# Patient Record
Sex: Female | Born: 1965
Health system: Southern US, Community
[De-identification: ages and names within clinical notes are randomized; demographics above are authoritative.]

---
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
## 1998-11-03 ENCOUNTER — Encounter: Admission: RE | Admit: 1998-11-03 | Discharge: 1999-02-01 | Payer: Self-pay | Admitting: Gynecology

## 1998-11-30 ENCOUNTER — Inpatient Hospital Stay (HOSPITAL_COMMUNITY): Admission: AD | Admit: 1998-11-30 | Discharge: 1998-12-02 | Payer: Self-pay | Admitting: Gynecology

## 1998-12-06 ENCOUNTER — Encounter (HOSPITAL_COMMUNITY): Admission: RE | Admit: 1998-12-06 | Discharge: 1999-03-06 | Payer: Self-pay | Admitting: Obstetrics and Gynecology

## 1999-01-05 ENCOUNTER — Other Ambulatory Visit: Admission: RE | Admit: 1999-01-05 | Discharge: 1999-01-05 | Payer: Self-pay | Admitting: Gynecology

## 2000-01-27 ENCOUNTER — Other Ambulatory Visit: Admission: RE | Admit: 2000-01-27 | Discharge: 2000-01-27 | Payer: Self-pay | Admitting: Gynecology

## 2000-09-10 ENCOUNTER — Ambulatory Visit (HOSPITAL_COMMUNITY): Admission: RE | Admit: 2000-09-10 | Discharge: 2000-09-10 | Payer: Self-pay | Admitting: Internal Medicine

## 2000-09-10 ENCOUNTER — Encounter: Payer: Self-pay | Admitting: Internal Medicine

## 2001-01-22 ENCOUNTER — Encounter: Admission: RE | Admit: 2001-01-22 | Discharge: 2001-01-29 | Payer: Self-pay | Admitting: Occupational Medicine

## 2001-02-04 ENCOUNTER — Other Ambulatory Visit: Admission: RE | Admit: 2001-02-04 | Discharge: 2001-02-04 | Payer: Self-pay | Admitting: Gynecology

## 2001-02-06 ENCOUNTER — Encounter (INDEPENDENT_AMBULATORY_CARE_PROVIDER_SITE_OTHER): Payer: Self-pay

## 2001-02-06 ENCOUNTER — Other Ambulatory Visit: Admission: RE | Admit: 2001-02-06 | Discharge: 2001-02-06 | Payer: Self-pay | Admitting: Gynecology

## 2001-08-06 ENCOUNTER — Other Ambulatory Visit: Admission: RE | Admit: 2001-08-06 | Discharge: 2001-08-06 | Payer: Self-pay | Admitting: Gynecology

## 2001-08-28 ENCOUNTER — Encounter: Admission: RE | Admit: 2001-08-28 | Discharge: 2001-11-26 | Payer: Self-pay | Admitting: Gynecology

## 2002-02-26 ENCOUNTER — Inpatient Hospital Stay (HOSPITAL_COMMUNITY): Admission: AD | Admit: 2002-02-26 | Discharge: 2002-02-26 | Payer: Self-pay | Admitting: Gynecology

## 2002-03-05 ENCOUNTER — Inpatient Hospital Stay (HOSPITAL_COMMUNITY): Admission: AD | Admit: 2002-03-05 | Discharge: 2002-03-07 | Payer: Self-pay | Admitting: Gynecology

## 2002-04-15 ENCOUNTER — Other Ambulatory Visit: Admission: RE | Admit: 2002-04-15 | Discharge: 2002-04-15 | Payer: Self-pay | Admitting: Gynecology

## 2003-06-08 ENCOUNTER — Other Ambulatory Visit: Admission: RE | Admit: 2003-06-08 | Discharge: 2003-06-08 | Payer: Self-pay | Admitting: Gynecology

## 2003-11-13 ENCOUNTER — Ambulatory Visit (HOSPITAL_COMMUNITY): Admission: RE | Admit: 2003-11-13 | Discharge: 2003-11-13 | Payer: Self-pay | Admitting: Gynecology

## 2003-11-27 ENCOUNTER — Other Ambulatory Visit: Admission: RE | Admit: 2003-11-27 | Discharge: 2003-11-27 | Payer: Self-pay | Admitting: Gynecology

## 2004-07-05 ENCOUNTER — Other Ambulatory Visit: Admission: RE | Admit: 2004-07-05 | Discharge: 2004-07-05 | Payer: Self-pay | Admitting: Gynecology

## 2004-11-14 ENCOUNTER — Ambulatory Visit (HOSPITAL_COMMUNITY): Admission: RE | Admit: 2004-11-14 | Discharge: 2004-11-14 | Payer: Self-pay | Admitting: Gynecology

## 2005-05-27 ENCOUNTER — Emergency Department (HOSPITAL_COMMUNITY): Admission: EM | Admit: 2005-05-27 | Discharge: 2005-05-27 | Payer: Self-pay | Admitting: Emergency Medicine

## 2005-07-10 ENCOUNTER — Other Ambulatory Visit: Admission: RE | Admit: 2005-07-10 | Discharge: 2005-07-10 | Payer: Self-pay | Admitting: Gynecology

## 2005-12-21 ENCOUNTER — Ambulatory Visit (HOSPITAL_COMMUNITY): Admission: RE | Admit: 2005-12-21 | Discharge: 2005-12-21 | Payer: Self-pay | Admitting: Gynecology

## 2006-07-02 ENCOUNTER — Emergency Department (HOSPITAL_COMMUNITY): Admission: EM | Admit: 2006-07-02 | Discharge: 2006-07-02 | Payer: Self-pay | Admitting: Family Medicine

## 2006-07-17 ENCOUNTER — Other Ambulatory Visit: Admission: RE | Admit: 2006-07-17 | Discharge: 2006-07-17 | Payer: Self-pay | Admitting: Gynecology

## 2006-08-23 ENCOUNTER — Encounter: Admission: RE | Admit: 2006-08-23 | Discharge: 2006-11-21 | Payer: Self-pay | Admitting: Family Medicine

## 2007-01-10 ENCOUNTER — Ambulatory Visit (HOSPITAL_COMMUNITY): Admission: RE | Admit: 2007-01-10 | Discharge: 2007-01-10 | Payer: Self-pay | Admitting: Gynecology

## 2008-02-03 ENCOUNTER — Ambulatory Visit (HOSPITAL_COMMUNITY): Admission: RE | Admit: 2008-02-03 | Discharge: 2008-02-03 | Payer: Self-pay | Admitting: Gynecology

## 2008-09-16 ENCOUNTER — Emergency Department (HOSPITAL_COMMUNITY): Admission: EM | Admit: 2008-09-16 | Discharge: 2008-09-16 | Payer: Self-pay | Admitting: Emergency Medicine

## 2009-03-03 ENCOUNTER — Ambulatory Visit (HOSPITAL_COMMUNITY): Admission: RE | Admit: 2009-03-03 | Discharge: 2009-03-03 | Payer: Self-pay | Admitting: Obstetrics and Gynecology

## 2009-12-17 ENCOUNTER — Emergency Department (HOSPITAL_COMMUNITY)
Admission: EM | Admit: 2009-12-17 | Discharge: 2009-12-17 | Payer: Self-pay | Source: Home / Self Care | Admitting: Emergency Medicine

## 2010-07-07 ENCOUNTER — Ambulatory Visit (HOSPITAL_COMMUNITY): Admission: RE | Admit: 2010-07-07 | Discharge: 2010-07-07 | Payer: Self-pay | Admitting: Obstetrics and Gynecology

## 2010-12-26 ENCOUNTER — Ambulatory Visit: Payer: 59 | Attending: Sports Medicine | Admitting: Physical Therapy

## 2010-12-26 DIAGNOSIS — M25619 Stiffness of unspecified shoulder, not elsewhere classified: Secondary | ICD-10-CM | POA: Insufficient documentation

## 2010-12-26 DIAGNOSIS — M25519 Pain in unspecified shoulder: Secondary | ICD-10-CM | POA: Insufficient documentation

## 2010-12-26 DIAGNOSIS — IMO0001 Reserved for inherently not codable concepts without codable children: Secondary | ICD-10-CM | POA: Insufficient documentation

## 2011-01-02 ENCOUNTER — Ambulatory Visit: Payer: 59 | Admitting: Physical Therapy

## 2011-01-04 ENCOUNTER — Encounter: Payer: 59 | Admitting: Physical Therapy

## 2011-01-09 ENCOUNTER — Ambulatory Visit: Payer: 59 | Admitting: Physical Therapy

## 2011-01-23 ENCOUNTER — Ambulatory Visit: Payer: 59 | Attending: Sports Medicine | Admitting: Physical Therapy

## 2011-01-23 DIAGNOSIS — IMO0001 Reserved for inherently not codable concepts without codable children: Secondary | ICD-10-CM | POA: Insufficient documentation

## 2011-01-23 DIAGNOSIS — M25619 Stiffness of unspecified shoulder, not elsewhere classified: Secondary | ICD-10-CM | POA: Insufficient documentation

## 2011-01-23 DIAGNOSIS — M25519 Pain in unspecified shoulder: Secondary | ICD-10-CM | POA: Insufficient documentation

## 2011-02-01 ENCOUNTER — Ambulatory Visit: Payer: 59 | Admitting: Physical Therapy

## 2011-02-17 ENCOUNTER — Other Ambulatory Visit (HOSPITAL_COMMUNITY): Payer: Self-pay | Admitting: Otolaryngology

## 2011-02-17 DIAGNOSIS — D333 Benign neoplasm of cranial nerves: Secondary | ICD-10-CM

## 2011-02-21 ENCOUNTER — Ambulatory Visit (HOSPITAL_COMMUNITY)
Admission: RE | Admit: 2011-02-21 | Discharge: 2011-02-21 | Disposition: A | Discharge status: Home / Self Care | Payer: 59 | Source: Ambulatory Visit | Attending: Otolaryngology | Admitting: Otolaryngology

## 2011-02-21 DIAGNOSIS — D333 Benign neoplasm of cranial nerves: Secondary | ICD-10-CM

## 2011-02-21 DIAGNOSIS — R42 Dizziness and giddiness: Secondary | ICD-10-CM | POA: Insufficient documentation

## 2011-02-21 MED ORDER — GADOBENATE DIMEGLUMINE 529 MG/ML IV SOLN
13.0000 mL | Freq: Once | INTRAVENOUS | Status: AC | PRN
  Administered 2011-02-21: 13 mL via INTRAVENOUS

## 2011-03-10 NOTE — Discharge Summary (Signed)
[facility]  Patient:    ALYVIAH, CRANDLE Visit Number: [id number] MRN: [id number]          Service Type: OBS Location: [id number] Attending Physician:  Douglass Rivers Dictated by:   Antony Contras, [facility] Admit Date:  03/05/2002 Discharge Date: 03/07/2002                             Discharge Summary  DISCHARGE DIAGNOSES:          Intrauterine pregnancy at term, spontaneous onset of labor, precipitous delivery, viable infant.  HISTORY OF PRESENT ILLNESS:   Patient is a 45 year old gravida 3, para 1-0-1-1, LMP May 28, 2001, [facility] Mar 04, 2002.  Prenatal course complicated by advanced maternal age, glucose intolerance.  Husband has HSV.  PRENATAL LABORATORIES:        Blood type B+.  Antibody screen negative.  RPR, HBSAG, HIV nonreactive.  GBS is positive.  HOSPITAL COURSE:              Patient was admitted on Mar 05, 2002 with spontaneous onset of labor.  She progressed quickly to complete dilatation. Precipitously delivered an Apgar 9/9 female infant over a midline episiotomy. Birth weight 7 pounds 9 ounces.  Placenta was manually extracted.  The patient was not given antibiotics secondary to precipitous delivery.  Postpartum course she remained afebrile.  Had no difficulty voiding.  Was able to be discharged in satisfactory condition on her second postpartum day.  DISCHARGE LABORATORIES:       CBC:  Hematocrit 30.8, hemoglobin 10.5, WBC 14.9, platelets 336,000.  DISPOSITION:                  Follow up in six weeks.  Continue prenatal vitamins and iron.  Motrin and Tylox for pain. Dictated by:   Antony Contras, [facility] Attending Physician:  Douglass Rivers DD:  03/25/02 TD:  03/27/02 Job: [id number]

## 2011-08-08 ENCOUNTER — Other Ambulatory Visit (HOSPITAL_COMMUNITY): Payer: Self-pay | Admitting: Obstetrics and Gynecology

## 2011-08-08 DIAGNOSIS — Z1231 Encounter for screening mammogram for malignant neoplasm of breast: Secondary | ICD-10-CM

## 2011-08-24 ENCOUNTER — Ambulatory Visit (HOSPITAL_COMMUNITY): Payer: 59

## 2011-09-20 ENCOUNTER — Ambulatory Visit (HOSPITAL_COMMUNITY)
Admission: RE | Admit: 2011-09-20 | Discharge: 2011-09-20 | Disposition: A | Discharge status: Home / Self Care | Payer: 59 | Source: Ambulatory Visit | Attending: Obstetrics and Gynecology | Admitting: Obstetrics and Gynecology

## 2011-09-20 DIAGNOSIS — Z1231 Encounter for screening mammogram for malignant neoplasm of breast: Secondary | ICD-10-CM | POA: Insufficient documentation

## 2012-09-04 ENCOUNTER — Encounter (INDEPENDENT_AMBULATORY_CARE_PROVIDER_SITE_OTHER): Payer: Self-pay

## 2012-09-05 ENCOUNTER — Ambulatory Visit (INDEPENDENT_AMBULATORY_CARE_PROVIDER_SITE_OTHER): Payer: Commercial Managed Care - PPO | Admitting: General Surgery

## 2012-09-05 ENCOUNTER — Encounter (INDEPENDENT_AMBULATORY_CARE_PROVIDER_SITE_OTHER): Payer: Self-pay | Admitting: General Surgery

## 2012-09-05 DIAGNOSIS — L7211 Pilar cyst: Secondary | ICD-10-CM

## 2012-09-05 NOTE — Patient Instructions (Signed)
We will schedule your surgery today. 
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
## 2012-09-06 NOTE — Progress Notes (Signed)
Talked with pt. No questions

## 2012-09-10 ENCOUNTER — Ambulatory Visit (HOSPITAL_BASED_OUTPATIENT_CLINIC_OR_DEPARTMENT_OTHER)
Admission: RE | Admit: 2012-09-10 | Discharge: 2012-09-10 | Disposition: A | Discharge status: Home / Self Care | Payer: 59 | Source: Ambulatory Visit | Attending: General Surgery | Admitting: General Surgery

## 2012-09-10 ENCOUNTER — Encounter (HOSPITAL_BASED_OUTPATIENT_CLINIC_OR_DEPARTMENT_OTHER): Payer: Self-pay | Admitting: *Deleted

## 2012-09-10 ENCOUNTER — Encounter (HOSPITAL_BASED_OUTPATIENT_CLINIC_OR_DEPARTMENT_OTHER)
Admission: RE | Disposition: A | Discharge status: Home / Self Care | Payer: Self-pay | Source: Ambulatory Visit | Attending: General Surgery

## 2012-09-10 DIAGNOSIS — L7211 Pilar cyst: Secondary | ICD-10-CM | POA: Insufficient documentation

## 2012-09-10 HISTORY — PX: LIPOMA EXCISION: SHX5283

## 2012-09-10 SURGERY — MINOR EXCISION LIPOMA
Anesthesia: LOCAL | Site: Scalp | Wound class: Clean

## 2012-09-10 MED ORDER — LIDOCAINE-EPINEPHRINE (PF) 1 %-1:200000 IJ SOLN
INTRAMUSCULAR | Status: DC | PRN
  Administered 2012-09-10: 13:00:00 via INTRAMUSCULAR

## 2012-09-10 SURGICAL SUPPLY — 24 items
BENZOIN TINCTURE PRP APPL 2/3 (GAUZE/BANDAGES/DRESSINGS) IMPLANT
BLADE SURG 10 STRL SS (BLADE) IMPLANT
BLADE SURG 15 STRL LF DISP TIS (BLADE) ×1 IMPLANT
BLADE SURG 15 STRL SS (BLADE) ×1
CHLORAPREP W/TINT 26ML (MISCELLANEOUS) IMPLANT
CLOTH BEACON ORANGE TIMEOUT ST (SAFETY) ×2 IMPLANT
COVER SURGICAL LIGHT HANDLE (MISCELLANEOUS) ×2 IMPLANT
DERMABOND ADVANCED (GAUZE/BANDAGES/DRESSINGS) ×1
DERMABOND ADVANCED .7 DNX12 (GAUZE/BANDAGES/DRESSINGS) ×1 IMPLANT
ELECT COATED BLADE 2.86 ST (ELECTRODE) IMPLANT
ELECT REM PT RETURN 9FT ADLT (ELECTROSURGICAL) ×2
ELECTRODE REM PT RTRN 9FT ADLT (ELECTROSURGICAL) ×1 IMPLANT
GAUZE SPONGE 4X4 12PLY STRL LF (GAUZE/BANDAGES/DRESSINGS) IMPLANT
GLOVE BIOGEL PI IND STRL 8.5 (GLOVE) ×1 IMPLANT
GLOVE BIOGEL PI INDICATOR 8.5 (GLOVE) ×1
GLOVE ECLIPSE 8.0 STRL XLNG CF (GLOVE) ×2 IMPLANT
NEEDLE HYPO 25X1 1.5 SAFETY (NEEDLE) ×2 IMPLANT
PENCIL BUTTON HOLSTER BLD 10FT (ELECTRODE) IMPLANT
SPONGE GAUZE 4X4 12PLY (GAUZE/BANDAGES/DRESSINGS) IMPLANT
STRIP CLOSURE SKIN 1/2X4 (GAUZE/BANDAGES/DRESSINGS) IMPLANT
SUT MNCRL AB 3-0 PS2 18 (SUTURE) ×2 IMPLANT
SUT MON AB 4-0 PC3 18 (SUTURE) IMPLANT
SUT VICRYL 3-0 CR8 SH (SUTURE) IMPLANT
SYR CONTROL 10ML LL (SYRINGE) ×2 IMPLANT

## 2012-09-10 NOTE — Interval H&P Note (Signed)
History and Physical Interval Note:  09/10/2012 12:38 PM  Rachel Norman  has presented today for surgery, with the diagnosis of pilar cyst  The various methods of treatment have been discussed with the patient and family. After consideration of risks, benefits and other options for treatment, the patient has consented to  Procedure(s) (LRB) with comments: MINOR EXCISION LIPOMA (N/A) - excision of scalp mass subq as a surgical intervention .  The patient's history has been reviewed, patient examined, no change in status, stable for surgery.  I have reviewed the patient's chart and labs.  Questions were answered to the patient's satisfaction.     Ousman Dise Shela Commons

## 2012-09-10 NOTE — Op Note (Signed)
Operative Note  Rachel Norman female 46 y.o. 09/10/2012  PREOPERATIVE DX:  Enlarging pilar cyst of parietal scalp  POSTOPERATIVE DX:  Same  PROCEDURE:  Excision of pilar cyst of parietal scalp ( 2 cm)         Surgeon: Adolph Pollack   Assistants: None  Anesthesia: Local anesthesia-1% Xylocaine  Indications: This is a 46 year old female with an enlarging scalp mass. Clinically it appears to be a pilar cyst. The skin is under tension. She now presents for removal.    Procedure Detail:  She was seen in the holding area. She is brought to the operating room placed on the operating table and placed in a semi-Fowler position.  The hair around the area was clipped and the artery were sterilely prepped and draped.  Local anesthetic was infiltrated in the skin over the mass. An elliptical incision was made through the skin. The cystic mass was uncovered and using sharp and blunt dissection it was freed from surrounding attachments. There was a small rupture of the cyst with some caseous material was evacuated. Other than that the cyst was removed  in its entirety. It measured 2 cm overall.  There is no remaining cyst capsule or debris in the wound.  Hemostasis was obtained using direct pressure. The wound was then closed with interrupted 3-0 Monocryl subcuticular stitches. Dermabond was applied.  She tolerated the procedure well without any apparent complications and will be discharged from the outpatient surgery Center. She was in satisfactory condition.  Estimated Blood Loss:  Minimal         Drains: none  Blood Given: none          Specimens: Cystic lesion of scalp        Complications:  * No complications entered in OR log *         Disposition: PACU - hemodynamically stable.         Condition: stable
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
## 2012-09-11 ENCOUNTER — Encounter (HOSPITAL_BASED_OUTPATIENT_CLINIC_OR_DEPARTMENT_OTHER): Payer: Self-pay | Admitting: General Surgery

## 2012-09-13 ENCOUNTER — Encounter (HOSPITAL_BASED_OUTPATIENT_CLINIC_OR_DEPARTMENT_OTHER): Payer: Self-pay

## 2012-09-13 ENCOUNTER — Telehealth (INDEPENDENT_AMBULATORY_CARE_PROVIDER_SITE_OTHER): Payer: Self-pay

## 2012-09-13 NOTE — Telephone Encounter (Signed)
LM for pt to call for path results consistent with a pilar cyst.

## 2012-09-16 ENCOUNTER — Telehealth (INDEPENDENT_AMBULATORY_CARE_PROVIDER_SITE_OTHER): Payer: Self-pay | Admitting: General Surgery

## 2012-09-16 NOTE — Telephone Encounter (Signed)
Pt returned call and given path results.

## 2012-09-30 ENCOUNTER — Ambulatory Visit (INDEPENDENT_AMBULATORY_CARE_PROVIDER_SITE_OTHER): Payer: Commercial Managed Care - PPO | Admitting: General Surgery

## 2012-09-30 ENCOUNTER — Encounter (INDEPENDENT_AMBULATORY_CARE_PROVIDER_SITE_OTHER): Payer: Self-pay | Admitting: General Surgery

## 2012-09-30 VITALS — BP 102/64 | HR 78 | Temp 97.8°F | Resp 16 | Ht 63.0 in | Wt 129.0 lb

## 2012-09-30 DIAGNOSIS — Z09 Encounter for follow-up examination after completed treatment for conditions other than malignant neoplasm: Secondary | ICD-10-CM | POA: Insufficient documentation

## 2012-09-30 NOTE — Progress Notes (Signed)
The patient comes in early for a check of her postoperative wound. She had a pyloric cyst removed at the apex of her scalp on her head by Dr. Delman Kitten. As done on 09/10/2012. The last couple of days she's noted more redness in the area. The central portion had a small stitch removed. There is a little bit and necrosis in the center but it is not wide and only measures maybe a millimeter or 2 in size.  Although the area of the incision is small but not firm it does not have any spreading erythema and there is no fluctuance. It does not look infected and does not require antibiotics.  She had an appointment in the future to see Dr. Kae Heller however she will cancel that unless she should have further problems.

## 2012-10-02 ENCOUNTER — Encounter (INDEPENDENT_AMBULATORY_CARE_PROVIDER_SITE_OTHER): Payer: Commercial Managed Care - PPO | Admitting: General Surgery

## 2012-10-10 ENCOUNTER — Other Ambulatory Visit (HOSPITAL_COMMUNITY): Payer: Self-pay | Admitting: Obstetrics and Gynecology

## 2012-10-10 DIAGNOSIS — Z1231 Encounter for screening mammogram for malignant neoplasm of breast: Secondary | ICD-10-CM

## 2012-10-29 ENCOUNTER — Ambulatory Visit (HOSPITAL_COMMUNITY): Payer: 59

## 2012-11-14 ENCOUNTER — Ambulatory Visit (HOSPITAL_COMMUNITY)
Admission: RE | Admit: 2012-11-14 | Discharge: 2012-11-14 | Disposition: A | Discharge status: Home / Self Care | Payer: 59 | Source: Ambulatory Visit | Attending: Obstetrics and Gynecology | Admitting: Obstetrics and Gynecology

## 2012-11-14 DIAGNOSIS — Z1231 Encounter for screening mammogram for malignant neoplasm of breast: Secondary | ICD-10-CM

## 2012-12-07 ENCOUNTER — Other Ambulatory Visit: Payer: Self-pay

## 2013-08-28 ENCOUNTER — Other Ambulatory Visit: Payer: Self-pay

## 2014-01-19 ENCOUNTER — Other Ambulatory Visit (HOSPITAL_COMMUNITY): Payer: Self-pay | Admitting: Obstetrics and Gynecology

## 2014-01-19 DIAGNOSIS — Z1231 Encounter for screening mammogram for malignant neoplasm of breast: Secondary | ICD-10-CM

## 2014-01-20 ENCOUNTER — Ambulatory Visit (HOSPITAL_COMMUNITY)
Admission: RE | Admit: 2014-01-20 | Discharge: 2014-01-20 | Disposition: A | Discharge status: Home / Self Care | Payer: 59 | Source: Ambulatory Visit | Attending: Obstetrics and Gynecology | Admitting: Obstetrics and Gynecology

## 2014-01-20 DIAGNOSIS — Z1231 Encounter for screening mammogram for malignant neoplasm of breast: Secondary | ICD-10-CM | POA: Insufficient documentation

## 2014-09-29 ENCOUNTER — Telehealth: Payer: Self-pay | Admitting: Family

## 2014-09-29 DIAGNOSIS — M545 Low back pain: Secondary | ICD-10-CM

## 2014-09-29 MED ORDER — CYCLOBENZAPRINE HCL 10 MG PO TABS: 10.0000 mg | ORAL_TABLET | Freq: Three times a day (TID) | ORAL | Status: DC | PRN

## 2014-09-29 MED ORDER — ETODOLAC 300 MG PO CAPS: 300.0000 mg | ORAL_CAPSULE | Freq: Two times a day (BID) | ORAL | Status: DC

## 2014-09-29 NOTE — Progress Notes (Signed)
We are sorry that you are not feeling well.  Here is how we plan to help!  Based on what you have shared with me it looks like you mostly have acute back pain.  Acute back pain is defined as musculoskeletal pain that can resolve in 1-3 weeks with conservative treatment.  I have prescribed Etodolac 300 mg twice a day non-steroid anti-inflammatory (NSAID) as well as Flexeril 10 mg every eight hours as needed which is a muscle relaxer.  Some patients experience stomach irritation or in increased heartburn with anti-inflammatory drugs.  Please keep in mind that muscle relaxer's can cause fatigue and should not be taken while at work or driving.  Back pain is very common.  The pain often gets better over time.  The cause of back pain is usually not dangerous.  Most people can learn to manage their back pain on their own.  Home Care  Stay active.  Start with short walks on flat ground if you can.  Try to walk farther each day.  Do not sit, drive or stand in one place for more than 30 minutes.  Do not stay in bed.  Do not avoid exercise or work.  Activity can help your back heal faster.  Be careful when you bend or lift an object.  Bend at your knees, keep the object close to you, and do not twist.  Sleep on a firm mattress.  Lie on your side, and bend your knees.  If you lie on your back, put a pillow under your knees.  Only take medicines as told by your doctor.  Put ice on the injured area.  Put ice in a plastic bag  Place a towel between your skin and the bag  Leave the ice on for 15-20 minutes, 3-4 times a day for the first 2-3 days.  After that, you can switch between ice and heat packs.  Ask your doctor about back exercises or massage.  Avoid feeling anxious or stressed.  Find good ways to deal with stress, such as exercise.  Get Help Right Way If:  Your pain does not go away with rest or medicine.  Your pain does not go away in 1 week.  You have new problems.  You do not  feel well.  The pain spreads into your legs.  You cannot control when you poop (bowel movement) or pee (urinate)  You feel sick to your stomach (nauseous) or throw up (vomit)  You have belly (abdominal) pain.  You feel like you may pass out (faint).  If you develop a fever.  Make Sure you:  Understand these instructions.  Will watch your condition  Will get help right away if you are not doing well or get worse.  Your e-visit answers were reviewed by a board certified advanced clinical practitioner to complete your personal care plan.  Depending on the condition, your plan could have included both over the counter or prescription medications.  Please review your pharmacy choice.  If there is a problem, you may call our nursing hot line at [phone] and have the prescription routed to another pharmacy.  Your safety is important to [place].  If you have drug allergies check your prescription carefully.    You can use MyChart to ask questions about today's visit, request a non-urgent call back, or ask for a work or school excuse.  You will get an e-mail in the next two days asking about your experience.  I hope that your  e-visit has been valuable and will speed your recovery. Thank you for using e-visits.

## 2014-10-01 ENCOUNTER — Ambulatory Visit: Payer: 59 | Attending: Sports Medicine | Admitting: Physical Therapy

## 2014-10-01 DIAGNOSIS — M25551 Pain in right hip: Secondary | ICD-10-CM | POA: Insufficient documentation

## 2014-10-01 DIAGNOSIS — M545 Low back pain: Secondary | ICD-10-CM | POA: Diagnosis present

## 2014-10-02 ENCOUNTER — Ambulatory Visit: Payer: 59 | Admitting: Physical Therapy

## 2014-10-02 DIAGNOSIS — M545 Low back pain: Secondary | ICD-10-CM | POA: Diagnosis not present

## 2014-10-05 ENCOUNTER — Ambulatory Visit: Payer: 59 | Admitting: Physical Therapy

## 2014-10-05 DIAGNOSIS — M545 Low back pain: Secondary | ICD-10-CM | POA: Diagnosis not present

## 2014-10-07 ENCOUNTER — Ambulatory Visit: Payer: 59 | Admitting: Physical Therapy

## 2014-10-08 ENCOUNTER — Ambulatory Visit: Payer: 59 | Admitting: Physical Therapy

## 2014-10-08 DIAGNOSIS — M545 Low back pain: Secondary | ICD-10-CM | POA: Diagnosis not present

## 2014-10-13 ENCOUNTER — Ambulatory Visit: Payer: 59 | Admitting: Physical Therapy

## 2014-10-13 DIAGNOSIS — M545 Low back pain: Secondary | ICD-10-CM | POA: Diagnosis not present

## 2014-10-15 ENCOUNTER — Ambulatory Visit: Payer: 59 | Admitting: Physical Therapy

## 2014-10-19 ENCOUNTER — Ambulatory Visit: Payer: 59 | Admitting: Physical Therapy

## 2014-10-19 DIAGNOSIS — M545 Low back pain: Secondary | ICD-10-CM | POA: Diagnosis not present

## 2014-10-21 ENCOUNTER — Ambulatory Visit: Payer: 59 | Admitting: Physical Therapy

## 2014-10-26 ENCOUNTER — Ambulatory Visit: Payer: 59 | Attending: Sports Medicine | Admitting: Physical Therapy

## 2014-10-26 DIAGNOSIS — M25551 Pain in right hip: Secondary | ICD-10-CM | POA: Insufficient documentation

## 2014-10-26 DIAGNOSIS — M545 Low back pain: Secondary | ICD-10-CM | POA: Diagnosis present

## 2014-10-29 ENCOUNTER — Encounter: Payer: 59 | Admitting: Physical Therapy

## 2014-10-30 ENCOUNTER — Ambulatory Visit: Payer: 59 | Admitting: Physical Therapy

## 2014-11-04 ENCOUNTER — Ambulatory Visit: Payer: 59 | Admitting: Physical Therapy

## 2014-11-11 ENCOUNTER — Ambulatory Visit: Payer: 59 | Admitting: Physical Therapy

## 2014-11-19 ENCOUNTER — Ambulatory Visit: Payer: 59 | Admitting: Physical Therapy

## 2014-11-19 DIAGNOSIS — M545 Low back pain: Secondary | ICD-10-CM | POA: Diagnosis not present

## 2014-11-25 ENCOUNTER — Ambulatory Visit: Payer: 59 | Attending: Sports Medicine | Admitting: Physical Therapy

## 2014-11-25 DIAGNOSIS — M25551 Pain in right hip: Secondary | ICD-10-CM | POA: Diagnosis not present

## 2014-11-25 DIAGNOSIS — M545 Low back pain: Secondary | ICD-10-CM | POA: Diagnosis present

## 2014-12-01 ENCOUNTER — Ambulatory Visit: Payer: 59 | Admitting: Physical Therapy

## 2014-12-01 DIAGNOSIS — M545 Low back pain: Secondary | ICD-10-CM | POA: Diagnosis not present

## 2014-12-07 ENCOUNTER — Ambulatory Visit: Payer: 59 | Admitting: Physical Therapy

## 2014-12-14 ENCOUNTER — Telehealth: Payer: Self-pay | Admitting: Family

## 2014-12-14 DIAGNOSIS — R05 Cough: Secondary | ICD-10-CM

## 2014-12-14 DIAGNOSIS — R059 Cough, unspecified: Secondary | ICD-10-CM

## 2014-12-14 MED ORDER — BENZONATATE 200 MG PO CAPS: 200.0000 mg | ORAL_CAPSULE | Freq: Three times a day (TID) | ORAL | Status: DC | PRN

## 2014-12-14 NOTE — Progress Notes (Signed)
We are sorry that you are not feeling well.  Here is how we plan to help!  Based on what you have shared with me it looks like you have upper respiratory tract inflammation that has resulted in a significant cough.  Inflammation and infection in the upper respiratory tract is commonly called bronchitis and has four common causes:  Allergies, Viral Infections, Acid Reflux and Bacterial Infections.  Allergies, viruses and acid reflux are treated by controlling symptoms or eliminating the cause. An example might be a cough caused by taking certain blood pressure medications. You stop the cough by changing the medication. Another example might be a cough caused by acid reflux. Controlling the reflux helps control the cough.  Based on your presentation I believe you most likely have A cough due to allergies.  I recommend that you start the an over-the counter-allergy medication such as Claritin 10 mg or Zyrtec 10 mg daily.    In addition you may use A non-prescription cough medication called Robitussin DAC. Take 2 teaspoons every 8 hours or Delsym: take 2 teaspoons every 12 hours., A non-prescription cough medication called Mucinex DM: take 2 tablets every 12 hours. and A prescription cough medication called Tessalon Perles 100mg . You may take 1-2 capsules every 8 hours as needed for your cough.    HOME CARE . Only take medications as instructed by your medical team. . Complete the entire course of an antibiotic. . Drink plenty of fluids and get plenty of rest. . Avoid close contacts especially the very young and the elderly . Cover your mouth if you cough or cough into your sleeve. . Always remember to wash your hands . A steam or ultrasonic humidifier can help congestion.    GET HELP RIGHT AWAY IF: . You develop worsening fever. . You become short of breath . You cough up blood. . Your symptoms persist after you have completed your treatment plan MAKE SURE YOU   Understand these  instructions.  Will watch your condition.  Will get help right away if you are not doing well or get worse.  Your e-visit answers were reviewed by a board certified advanced clinical practitioner to complete your personal care plan.  Depending on the condition, your plan could have included both over the counter or prescription medications.  If there is a problem please reply  once you have received a response from your provider.  Your safety is important to [place].  If you have drug allergies check your prescription carefully.    You can use MyChart to ask questions about today's visit, request a non-urgent call back, or ask for a work or school excuse.  You will get an e-mail in the next two days asking about your experience.  I hope that your e-visit has been valuable and will speed your recovery. Thank you for using e-visits.

## 2015-01-05 ENCOUNTER — Ambulatory Visit: Payer: 59 | Admitting: Physical Therapy

## 2015-01-19 ENCOUNTER — Other Ambulatory Visit (HOSPITAL_COMMUNITY): Payer: Self-pay | Admitting: Obstetrics and Gynecology

## 2015-01-19 ENCOUNTER — Other Ambulatory Visit: Payer: Self-pay

## 2015-01-19 DIAGNOSIS — Z1231 Encounter for screening mammogram for malignant neoplasm of breast: Secondary | ICD-10-CM

## 2015-01-22 ENCOUNTER — Ambulatory Visit (HOSPITAL_COMMUNITY)
Admission: RE | Admit: 2015-01-22 | Discharge: 2015-01-22 | Disposition: A | Discharge status: Home / Self Care | Payer: 59 | Source: Ambulatory Visit | Attending: Obstetrics and Gynecology | Admitting: Obstetrics and Gynecology

## 2015-01-22 DIAGNOSIS — Z1231 Encounter for screening mammogram for malignant neoplasm of breast: Secondary | ICD-10-CM | POA: Diagnosis present

## 2015-04-22 ENCOUNTER — Other Ambulatory Visit: Payer: Self-pay | Admitting: Obstetrics and Gynecology

## 2015-04-23 LAB — CYTOLOGY - PAP

## 2015-05-14 ENCOUNTER — Telehealth: Payer: 59 | Admitting: Family

## 2015-05-14 DIAGNOSIS — H109 Unspecified conjunctivitis: Secondary | ICD-10-CM

## 2015-05-14 MED ORDER — POLYMYXIN B-TRIMETHOPRIM 10000-0.1 UNIT/ML-% OP SOLN: 1.0000 [drp] | Freq: Four times a day (QID) | OPHTHALMIC | Status: DC

## 2015-05-14 NOTE — Progress Notes (Signed)
We are sorry that you are not feeling well.  Here is how we plan to help!   Based on what you have shared with me it looks like you have conjunctivitis.  Conjunctivitis is a common inflammatory or infectious condition of the eye that is often referred to as "pink eye".  In most cases it is contagious (viral or bacterial). However, not all conjunctivitis requires antibiotics (ex. Allergic).  We have made appropriate suggestions for you based upon your presentation.  I have prescribed Polytrim Ophthalmic drops 1-2 drops 4 times a day times 5 days  Pink eye can be highly contagious.  It is typically spread through direct contact with secretions, or contaminated objects or surfaces that one may have touched.  Strict handwashing is suggested with soap and water is urged.  If not available, use alcohol based had sanitizer.  Avoid unnecessary touching of the eye.  If you wear contact lenses, you will need to refrain from wearing them until you see no white discharge from the eye for at least 24 hours after being on medication.  You should see symptom improvement in 1-2 days after starting the medication regimen.  Call us if symptoms are not improved in 1-2 days.  Home Care:  Wash your hands often!  Do not wear your contacts until you complete your treatment plan.  Avoid sharing towels, bed linen, personal items with a person who has pink eye.  See attention for anyone in your home with similar symptoms.  Get Help Right Away If:  Your symptoms do not improve.  You develop blurred or loss of vision.  Your symptoms worsen (increased discharge, pain or redness)  Your e-visit answers were reviewed by a board certified advanced clinical practitioner to complete your personal care plan.  Depending on the condition, your plan could have included both over the counter or prescription medications.  If there is a problem please reply  once you have received a response from your provider.  Your safety is  important to us.  If you have drug allergies check your prescription carefully.    You can use MyChart to ask questions about today's visit, request a non-urgent call back, or ask for a work or school excuse.  You will get an e-mail in the next two days asking about your experience.  I hope that your e-visit has been valuable and will speed your recovery. Thank you for using e-visits.      

## 2016-03-13 ENCOUNTER — Other Ambulatory Visit: Payer: Self-pay

## 2016-03-13 DIAGNOSIS — Z1231 Encounter for screening mammogram for malignant neoplasm of breast: Secondary | ICD-10-CM

## 2016-03-23 DIAGNOSIS — H5213 Myopia, bilateral: Secondary | ICD-10-CM | POA: Diagnosis not present

## 2016-04-03 ENCOUNTER — Ambulatory Visit
Admission: RE | Admit: 2016-04-03 | Discharge: 2016-04-03 | Disposition: A | Discharge status: Home / Self Care | Payer: 59 | Source: Ambulatory Visit

## 2016-04-03 DIAGNOSIS — Z1231 Encounter for screening mammogram for malignant neoplasm of breast: Secondary | ICD-10-CM | POA: Diagnosis not present

## 2016-04-07 ENCOUNTER — Other Ambulatory Visit (HOSPITAL_COMMUNITY): Payer: Self-pay | Admitting: Obstetrics and Gynecology

## 2016-04-07 DIAGNOSIS — Z803 Family history of malignant neoplasm of breast: Secondary | ICD-10-CM

## 2016-04-21 ENCOUNTER — Ambulatory Visit (HOSPITAL_COMMUNITY)
Admission: RE | Admit: 2016-04-21 | Discharge: 2016-04-21 | Disposition: A | Discharge status: Home / Self Care | Payer: 59 | Source: Ambulatory Visit | Attending: Obstetrics and Gynecology | Admitting: Obstetrics and Gynecology

## 2016-04-21 DIAGNOSIS — Z803 Family history of malignant neoplasm of breast: Secondary | ICD-10-CM | POA: Insufficient documentation

## 2016-04-21 DIAGNOSIS — N6489 Other specified disorders of breast: Secondary | ICD-10-CM | POA: Diagnosis not present

## 2016-04-21 MED ORDER — GADOBENATE DIMEGLUMINE 529 MG/ML IV SOLN
12.0000 mL | Freq: Once | INTRAVENOUS | Status: AC | PRN
  Administered 2016-04-21: 12 mL via INTRAVENOUS

## 2016-05-01 DIAGNOSIS — Z01419 Encounter for gynecological examination (general) (routine) without abnormal findings: Secondary | ICD-10-CM | POA: Diagnosis not present

## 2016-05-01 DIAGNOSIS — N816 Rectocele: Secondary | ICD-10-CM | POA: Diagnosis not present

## 2016-05-01 DIAGNOSIS — Z6823 Body mass index (BMI) 23.0-23.9, adult: Secondary | ICD-10-CM | POA: Diagnosis not present

## 2016-05-01 DIAGNOSIS — Z01411 Encounter for gynecological examination (general) (routine) with abnormal findings: Secondary | ICD-10-CM | POA: Diagnosis not present

## 2016-05-01 DIAGNOSIS — N8110 Cystocele, unspecified: Secondary | ICD-10-CM | POA: Diagnosis not present

## 2016-06-09 ENCOUNTER — Telehealth: Payer: 59 | Admitting: Nurse Practitioner

## 2016-06-09 DIAGNOSIS — H578 Other specified disorders of eye and adnexa: Secondary | ICD-10-CM

## 2016-06-09 DIAGNOSIS — H5789 Other specified disorders of eye and adnexa: Secondary | ICD-10-CM

## 2016-06-09 DIAGNOSIS — H109 Unspecified conjunctivitis: Secondary | ICD-10-CM

## 2016-06-09 MED ORDER — POLYMYXIN B-TRIMETHOPRIM 10000-0.1 UNIT/ML-% OP SOLN: 1.0000 [drp] | Freq: Four times a day (QID) | OPHTHALMIC | 0 refills | Status: DC

## 2016-06-09 NOTE — Progress Notes (Signed)

## 2016-06-28 ENCOUNTER — Telehealth: Payer: 59 | Admitting: Nurse Practitioner

## 2016-06-28 DIAGNOSIS — B354 Tinea corporis: Secondary | ICD-10-CM | POA: Diagnosis not present

## 2016-06-28 MED ORDER — CLOTRIMAZOLE 1 % EX CREA: 1.0000 "application " | TOPICAL_CREAM | Freq: Two times a day (BID) | CUTANEOUS | 0 refills | Status: DC

## 2016-06-28 NOTE — Progress Notes (Signed)
E Visit for Rash  We are sorry that you are not feeling well. Here is how we plan to help!   I agree looks like ringworm- lotrisone cream rx was sent to pharmacy Use as directed- ringworm is a fungal skin infection: try not to pick or scratch. Good handwashing after applying cream.    HOME CARE:   Take cool showers and avoid direct sunlight.  Apply cool compress or wet dressings.  Take a bath in an oatmeal bath.  Sprinkle content of one Aveeno packet under running faucet with comfortably warm water.  Bathe for 15-20 minutes, 1-2 times daily.  Pat dry with a towel. Do not rub the rash.  Use hydrocortisone cream.  Take an antihistamine like Benadryl for widespread rashes that itch.  The adult dose of Benadryl is 25-50 mg by mouth 4 times daily.  Caution:  This type of medication may cause sleepiness.  Do not drink alcohol, drive, or operate dangerous machinery while taking antihistamines.  Do not take these medications if you have prostate enlargement.  Read package instructions thoroughly on all medications that you take.  GET HELP RIGHT AWAY IF:   Symptoms don't go away after treatment.  Severe itching that persists.  If you rash spreads or swells.  If you rash begins to smell.  If it blisters and opens or develops a yellow-brown crust.  You develop a fever.  You have a sore throat.  You become short of breath.  MAKE SURE YOU:  Understand these instructions. Will watch your condition. Will get help right away if you are not doing well or get worse.  Thank you for choosing an e-visit. Your e-visit answers were reviewed by a board certified advanced clinical practitioner to complete your personal care plan. Depending upon the condition, your plan could have included both over the counter or prescription medications. Please review your pharmacy choice. Be sure that the pharmacy you have chosen is open so that you can pick up your prescription now.  If there is a  problem you may message your provider in [place] to have the prescription routed to another pharmacy. Your safety is important to [place]. If you have drug allergies check your prescription carefully.  For the next 24 hours, you can use MyChart to ask questions about today's visit, request a non-urgent call back, or ask for a work or school excuse from your e-visit provider. You will get an email in the next two days asking about your experience. I hope that your e-visit has been valuable and will speed your recovery.

## 2016-08-29 DIAGNOSIS — L920 Granuloma annulare: Secondary | ICD-10-CM | POA: Diagnosis not present

## 2016-08-29 DIAGNOSIS — L814 Other melanin hyperpigmentation: Secondary | ICD-10-CM | POA: Diagnosis not present

## 2016-08-29 DIAGNOSIS — D1801 Hemangioma of skin and subcutaneous tissue: Secondary | ICD-10-CM | POA: Diagnosis not present

## 2016-08-29 DIAGNOSIS — L821 Other seborrheic keratosis: Secondary | ICD-10-CM | POA: Diagnosis not present

## 2016-08-29 DIAGNOSIS — Z23 Encounter for immunization: Secondary | ICD-10-CM | POA: Diagnosis not present

## 2016-08-29 DIAGNOSIS — D2371 Other benign neoplasm of skin of right lower limb, including hip: Secondary | ICD-10-CM | POA: Diagnosis not present

## 2016-12-02 ENCOUNTER — Telehealth: Payer: 59 | Admitting: Physician Assistant

## 2016-12-02 DIAGNOSIS — M5441 Lumbago with sciatica, right side: Secondary | ICD-10-CM

## 2016-12-02 MED ORDER — CYCLOBENZAPRINE HCL 10 MG PO TABS: 10.0000 mg | ORAL_TABLET | Freq: Three times a day (TID) | ORAL | 0 refills | Status: DC | PRN

## 2016-12-02 MED ORDER — NAPROXEN 500 MG PO TABS: 500.0000 mg | ORAL_TABLET | Freq: Two times a day (BID) | ORAL | 0 refills | Status: DC

## 2016-12-02 NOTE — Progress Notes (Signed)
We are sorry that you are not feeling well.  Here is how we plan to help!  Based on what you have shared with me it looks like you mostly have acute back pain  Acute back pain is defined as musculoskeletal pain that can resolve in 1-3 weeks with conservative treatment.  I have prescribed Naprosyn 500 mg twice a day non-steroid anti-inflammatory (NSAID) as well as Flexeril 10 mg every eight hours as needed which is a muscle relaxer  Some patients experience stomach irritation or in increased heartburn with anti-inflammatory drugs.  Please keep in mind that muscle relaxer's can cause fatigue and should not be taken while at work or driving.  Back pain is very common.  The pain often gets better over time.  The cause of back pain is usually not dangerous.  Most people can learn to manage their back pain on their own.  We do not set up referrals or consults via e-visits. I encourage you to schedule and appointment with your primary care provider to discuss physical therapy.   Home Care  Stay active.  Start with short walks on flat ground if you can.  Try to walk farther each day.  Do not sit, drive or stand in one place for more than 30 minutes.  Do not stay in bed.  Do not avoid exercise or work.  Activity can help your back heal faster.  Be careful when you bend or lift an object.  Bend at your knees, keep the object close to you, and do not twist.  Sleep on a firm mattress.  Lie on your side, and bend your knees.  If you lie on your back, put a pillow under your knees.  Only take medicines as told by your doctor.  Put ice on the injured area.  Put ice in a plastic bag  Place a towel between your skin and the bag  Leave the ice on for 15-20 minutes, 3-4 times a day for the first 2-3 days. 210 After that, you can switch between ice and heat packs.  Ask your doctor about back exercises or massage.  Avoid feeling anxious or stressed.  Find good ways to deal with stress, such as  exercise.  Get Help Right Way If:  Your pain does not go away with rest or medicine.  Your pain does not go away in 1 week.  You have new problems.  You do not feel well.  The pain spreads into your legs.  You cannot control when you poop (bowel movement) or pee (urinate)  You feel sick to your stomach (nauseous) or throw up (vomit)  You have belly (abdominal) pain.  You feel like you may pass out (faint).  If you develop a fever.  Make Sure you:  Understand these instructions.  Will watch your condition  Will get help right away if you are not doing well or get worse.  Your e-visit answers were reviewed by a board certified advanced clinical practitioner to complete your personal care plan.  Depending on the condition, your plan could have included both over the counter or prescription medications.  If there is a problem please reply  once you have received a response from your provider.  Your safety is important to [place].  If you have drug allergies check your prescription carefully.    You can use MyChart to ask questions about today's visit, request a non-urgent call back, or ask for a work or school excuse for 24 hours  related to this e-Visit. If it has been greater than 24 hours you will need to follow up with your provider, or enter a new e-Visit to address those concerns.  You will get an e-mail in the next two days asking about your experience.  I hope that your e-visit has been valuable and will speed your recovery. Thank you for using e-visits.

## 2016-12-15 DIAGNOSIS — M5441 Lumbago with sciatica, right side: Secondary | ICD-10-CM | POA: Diagnosis not present

## 2016-12-15 MED FILL — predniSONE 10 MG TABS: 10 | 6 days supply | Qty: 21 | Fill #0

## 2016-12-22 ENCOUNTER — Encounter: Payer: Self-pay | Admitting: Physical Therapy

## 2016-12-22 ENCOUNTER — Ambulatory Visit: Payer: 59 | Attending: Sports Medicine | Admitting: Physical Therapy

## 2016-12-22 DIAGNOSIS — M6281 Muscle weakness (generalized): Secondary | ICD-10-CM

## 2016-12-22 DIAGNOSIS — M5441 Lumbago with sciatica, right side: Secondary | ICD-10-CM
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
## 2016-12-27 ENCOUNTER — Ambulatory Visit: Payer: 59 | Admitting: Physical Therapy

## 2016-12-27 ENCOUNTER — Encounter: Payer: Self-pay | Admitting: Physical Therapy

## 2016-12-27 DIAGNOSIS — M6281 Muscle weakness (generalized): Secondary | ICD-10-CM | POA: Diagnosis not present

## 2016-12-27 DIAGNOSIS — M5441 Lumbago with sciatica, right side: Secondary | ICD-10-CM | POA: Diagnosis not present
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
## 2016-12-27 NOTE — Patient Instructions (Addendum)
Diastasis Recti Correction With Hands (Hook-Lying)    Spread hands wide on lower abdomen. Inhale. In one fluid movement: Pull in navel, push abdomen muscles together, exhale, raise head toward chest, and Hold for _5__ seconds. Return, rest for _5__ seconds. Repeat _10__ times. Do _3__ times a day.   Copyright  VHI. All rights reserved.    Hook-Lying    Lie with hips and knees bent. Allow body's muscles to relax. Place hands on belly. Inhale slowly and deeply for _3__ seconds, so hands move up. Then take _3__ seconds to exhale. Repeat _5__ times. Do _3__ times a day.   Copyright  VHI. All rights reserved.  Sitting    Sit comfortably. Allow body's muscles to relax. Place hands on belly. Inhale slowly and deeply for _3__ seconds, so hands move out. Then take _3__ seconds to exhale. Repeat _5__ times. Do _3__ times a day.  Copyright  VHI. All rights reserved.  [facility] [address], [place], [place] [address] Phone # [phone] Fax [phone]

## 2016-12-29 ENCOUNTER — Ambulatory Visit: Payer: 59 | Admitting: Physical Therapy

## 2016-12-29 DIAGNOSIS — M5441 Lumbago with sciatica, right side: Secondary | ICD-10-CM | POA: Diagnosis not present

## 2016-12-29 DIAGNOSIS — M6281 Muscle weakness (generalized): Secondary | ICD-10-CM

## 2016-12-29 NOTE — Patient Instructions (Signed)
     Trigger Point Dry Needling  . What is Trigger Point Dry Needling (DN)? o DN is a physical therapy technique used to treat muscle pain and dysfunction. Specifically, DN helps deactivate muscle trigger points (muscle knots).  o A thin filiform needle is used to penetrate the skin and stimulate the underlying trigger point. The goal is for a local twitch response (LTR) to occur and for the trigger point to relax. No medication of any kind is injected during the procedure.   . What Does Trigger Point Dry Needling Feel Like?  o The procedure feels different for each individual patient. Some patients report that they do not actually feel the needle enter the skin and overall the process is not painful. Very mild bleeding may occur. However, many patients feel a deep cramping in the muscle in which the needle was inserted. This is the local twitch response.   . How Will I feel after the treatment? o Soreness is normal, and the onset of soreness may not occur for a few hours. Typically this soreness does not last longer than two days.  o Bruising is uncommon, however; ice can be used to decrease any possible bruising.  o In rare cases feeling tired or nauseous after the treatment is normal. In addition, your symptoms may get worse before they get better, this period will typically not last longer than 24 hours.   . What Can I do After My Treatment? o Increase your hydration by drinking more water for the next 24 hours. o You may place ice or heat on the areas treated that have become sore, however, do not use heat on inflamed or bruised areas. Heat often brings more relief post needling. o You can continue your regular activities, but vigorous activity is not recommended initially after the treatment for 24 hours. o DN is best combined with other physical therapy such as strengthening, stretching, and other therapies.    Joscelin Fray PT [facility] [address], [address] [place], [place] [address] Phone # [phone] Fax [phone] 
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
## 2017-01-01 ENCOUNTER — Ambulatory Visit: Payer: 59 | Admitting: Physical Therapy

## 2017-01-03 ENCOUNTER — Ambulatory Visit: Payer: 59 | Admitting: Physical Therapy

## 2017-01-03 ENCOUNTER — Encounter: Payer: Self-pay | Admitting: Physical Therapy

## 2017-01-03 DIAGNOSIS — M5441 Lumbago with sciatica, right side: Secondary | ICD-10-CM | POA: Diagnosis not present

## 2017-01-03 DIAGNOSIS — M6281 Muscle weakness (generalized): Secondary | ICD-10-CM | POA: Diagnosis not present

## 2017-01-03 NOTE — Patient Instructions (Signed)
   Begin by lying flat on your back with your knees bent, feet together. The band should be tight around both knees so you're beginning the exercise with tension already. Keeping the feet together, one leg should stabilize while the other is falling out to the side against the resistance. Bring the knee back to center with control and repeat 10 times before switching to the other leg 10 times each leg.     Lay on back with knees bent. Keeping you stomach muscles tight, lift one leg a few inches off the floor, place back down, and repeat with the other leg. One on each side is one repetition. 10 times each leg    Lie on your stomach.  1) Engage core 2) Squeeze gluts 3) Tighten quads 5) Lift leg about 4 inches from table.and toward wall.  5) 10 time each leg 1 time per day.   Relax all muscles. Repeat. Keep pillow under stomach, keep equal pressure on both hips.   [facility] [address], [place], [place] [address] Phone # [phone] Fax [phone]
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
## 2017-01-09 ENCOUNTER — Encounter: Payer: Self-pay | Admitting: Physical Therapy

## 2017-01-09 ENCOUNTER — Ambulatory Visit: Payer: 59 | Admitting: Physical Therapy

## 2017-01-09 DIAGNOSIS — M5441 Lumbago with sciatica, right side: Secondary | ICD-10-CM

## 2017-01-09 DIAGNOSIS — M6281 Muscle weakness (generalized): Secondary | ICD-10-CM
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
## 2017-01-09 NOTE — Patient Instructions (Addendum)
High Stepping in Place (Sitting)    Sitting, alternately lift knees as low to med. Keep torso erect and abdominals braced. Contract pelvic floor.  Repeat _5___ times, each leg. 4 times per day.  Copyright  VHI. All rights reserved.  [facility] [address] [place], [place] [address] Phone # [phone] Fax [phone] IONTOPHORESIS PATIENT PRECAUTIONS & CONTRAINDICATIONS:  . Redness under one or both electrodes can occur.  This characterized by a uniform redness that usually disappears within 12 hours of treatment. . Small pinhead size blisters may result in response to the drug.  Contact your physician if the problem persists more than 24 hours. . On rare occasions, iontophoresis therapy can result in temporary skin reactions such as rash, inflammation, irritation or burns.  The skin reactions may be the result of individual sensitivity to the ionic solution used, the condition of the skin at the start of treatment, reaction to the materials in the electrodes, allergies or sensitivity to dexamethasone, or a poor connection between the patch and your skin.  Discontinue using iontophoresis if you have any of these reactions and report to your therapist. . Remove the Patch or electrodes if you have any undue sensation of pain or burning during the treatment and report discomfort to your therapist. . Tell your Therapist if you have had known adverse reactions to the application of electrical current. . If using the Patch, the LED light will turn off when treatment is complete and the patch can be removed.  Approximate treatment time is 1-3 hours.  Remove the patch when light goes off or after 6 hours. . The Patch can be worn during normal activity, however excessive motion where the electrodes have been placed can cause poor contact between the skin and the electrode or uneven electrical current resulting in greater risk of skin irritation. [place] Keep out of the reach  of children.   . DO NOT use if you have a cardiac pacemaker or any other electrically sensitive implanted device. . DO NOT use if you have a known sensitivity to dexamethasone. . DO NOT use during Magnetic Resonance Imaging (MRI). . DO NOT use over broken or compromised skin (e.g. sunburn, cuts, or acne) due to the increased risk of skin reaction. . DO NOT SHAVE over the area to be treated:  To establish good contact between the Patch and the skin, excessive hair may be clipped. . DO NOT place the Patch or electrodes on or over your eyes, directly over your heart, or brain. . DO NOT reuse the Patch or electrodes as this may cause burns to occur.

## 2017-01-11 ENCOUNTER — Ambulatory Visit: Payer: 59 | Admitting: Physical Therapy

## 2017-01-11 DIAGNOSIS — M6281 Muscle weakness (generalized): Secondary | ICD-10-CM

## 2017-01-11 DIAGNOSIS — M5441 Lumbago with sciatica, right side: Secondary | ICD-10-CM | POA: Diagnosis not present
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
## 2017-01-16 ENCOUNTER — Encounter: Payer: 59 | Admitting: Physical Therapy

## 2017-01-18 ENCOUNTER — Encounter: Payer: Self-pay | Admitting: Physical Therapy

## 2017-01-18 ENCOUNTER — Ambulatory Visit: Payer: 59 | Admitting: Physical Therapy

## 2017-01-18 DIAGNOSIS — M6281 Muscle weakness (generalized): Secondary | ICD-10-CM

## 2017-01-18 DIAGNOSIS — M5441 Lumbago with sciatica, right side: Secondary | ICD-10-CM | POA: Diagnosis not present

## 2017-01-18 NOTE — Patient Instructions (Signed)
Lay on back , flex hips to 90 degrees, bring hips to right side, place left hand on left ribcage- hold till no pain  Next step is to lay on stomach with hips to left 1 min Next step is to go on elbows 1 min.   If unable to do above then do wall lean till pain goes away, then extend spine 10x.   Take off iont at 1100PM  [facility] [address], [place], [place] [address] Phone # [phone] Fax [phone]
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
## 2017-01-25 ENCOUNTER — Ambulatory Visit: Payer: 59 | Attending: Sports Medicine | Admitting: Physical Therapy

## 2017-01-25 ENCOUNTER — Encounter: Payer: 59 | Admitting: Physical Therapy

## 2017-01-25 DIAGNOSIS — M5441 Lumbago with sciatica, right side: Secondary | ICD-10-CM | POA: Diagnosis not present

## 2017-01-25 DIAGNOSIS — M6281 Muscle weakness (generalized): Secondary | ICD-10-CM | POA: Diagnosis not present
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
## 2017-01-26 ENCOUNTER — Other Ambulatory Visit (HOSPITAL_COMMUNITY): Payer: Self-pay | Admitting: Sports Medicine

## 2017-01-26 DIAGNOSIS — M25551 Pain in right hip: Secondary | ICD-10-CM | POA: Diagnosis not present

## 2017-01-26 DIAGNOSIS — M5441 Lumbago with sciatica, right side: Secondary | ICD-10-CM

## 2017-01-31 ENCOUNTER — Ambulatory Visit: Payer: 59 | Admitting: Physical Therapy

## 2017-02-01 ENCOUNTER — Encounter: Payer: Self-pay | Admitting: Physical Therapy

## 2017-02-02 ENCOUNTER — Encounter: Payer: 59 | Admitting: Physical Therapy

## 2017-02-03 ENCOUNTER — Ambulatory Visit (HOSPITAL_COMMUNITY): Admission: RE | Admit: 2017-02-03 | Payer: 59 | Source: Ambulatory Visit

## 2017-02-05 ENCOUNTER — Encounter: Payer: 59 | Admitting: Physical Therapy

## 2017-02-09 ENCOUNTER — Encounter: Payer: 59 | Admitting: Physical Therapy

## 2017-02-10 DIAGNOSIS — M5441 Lumbago with sciatica, right side: Secondary | ICD-10-CM | POA: Diagnosis not present

## 2017-02-13 ENCOUNTER — Encounter: Payer: Self-pay | Admitting: Physical Therapy

## 2017-02-15 ENCOUNTER — Encounter: Payer: Self-pay | Admitting: Physical Therapy

## 2017-02-19 DIAGNOSIS — M5441 Lumbago with sciatica, right side: Secondary | ICD-10-CM | POA: Diagnosis not present

## 2017-02-19 DIAGNOSIS — M5126 Other intervertebral disc displacement, lumbar region: Secondary | ICD-10-CM | POA: Diagnosis not present

## 2017-03-30 ENCOUNTER — Other Ambulatory Visit: Payer: Self-pay | Admitting: Obstetrics and Gynecology

## 2017-03-30 DIAGNOSIS — Z1231 Encounter for screening mammogram for malignant neoplasm of breast: Secondary | ICD-10-CM

## 2017-03-30 DIAGNOSIS — N760 Acute vaginitis: Secondary | ICD-10-CM | POA: Diagnosis not present

## 2017-03-30 MED FILL — metroNIDAZOLE 500 MG TABS: 500 | 7 days supply | Qty: 14 | Fill #0

## 2017-04-03 MED FILL — metroNIDAZOLE 0.75 % GEL: 0.75 | 5 days supply | Qty: 70 | Fill #0

## 2017-04-11 ENCOUNTER — Ambulatory Visit
Admission: RE | Admit: 2017-04-11 | Discharge: 2017-04-11 | Disposition: A | Discharge status: Home / Self Care | Payer: 59 | Source: Ambulatory Visit | Attending: Obstetrics and Gynecology | Admitting: Obstetrics and Gynecology

## 2017-04-11 DIAGNOSIS — Z1231 Encounter for screening mammogram for malignant neoplasm of breast: Secondary | ICD-10-CM

## 2017-07-10 DIAGNOSIS — Z6834 Body mass index (BMI) 34.0-34.9, adult: Secondary | ICD-10-CM | POA: Diagnosis not present

## 2017-07-10 DIAGNOSIS — Z01419 Encounter for gynecological examination (general) (routine) without abnormal findings: Secondary | ICD-10-CM | POA: Diagnosis not present

## 2017-07-16 DIAGNOSIS — H5213 Myopia, bilateral: Secondary | ICD-10-CM | POA: Diagnosis not present

## 2017-09-05 DIAGNOSIS — L02214 Cutaneous abscess of groin: Secondary | ICD-10-CM | POA: Diagnosis not present

## 2017-09-05 MED FILL — CEPHALEXIN 500 MG CAPSULE: 500 | 7 days supply | Qty: 28 | Fill #0

## 2017-09-06 DIAGNOSIS — S61219A Laceration without foreign body of unspecified finger without damage to nail, initial encounter: Secondary | ICD-10-CM | POA: Diagnosis not present

## 2017-11-01 ENCOUNTER — Other Ambulatory Visit: Payer: Self-pay | Admitting: Radiology

## 2017-11-01 DIAGNOSIS — N6320 Unspecified lump in the left breast, unspecified quadrant: Secondary | ICD-10-CM | POA: Diagnosis not present

## 2017-11-06 ENCOUNTER — Ambulatory Visit
Admission: RE | Admit: 2017-11-06 | Discharge: 2017-11-06 | Disposition: A | Discharge status: Home / Self Care | Payer: 59 | Source: Ambulatory Visit | Attending: Radiology | Admitting: Radiology

## 2017-11-06 ENCOUNTER — Other Ambulatory Visit: Payer: Self-pay | Admitting: Radiology

## 2017-11-06 DIAGNOSIS — N6320 Unspecified lump in the left breast, unspecified quadrant: Secondary | ICD-10-CM

## 2017-11-06 DIAGNOSIS — R922 Inconclusive mammogram: Secondary | ICD-10-CM | POA: Diagnosis not present

## 2017-12-07 ENCOUNTER — Ambulatory Visit: Payer: Self-pay | Admitting: Nurse Practitioner

## 2017-12-07 VITALS — BP 125/60 | HR 77 | Temp 97.7°F | Resp 16 | Wt 142.6 lb

## 2017-12-07 DIAGNOSIS — N390 Urinary tract infection, site not specified: Secondary | ICD-10-CM

## 2017-12-07 DIAGNOSIS — R350 Frequency of micturition: Secondary | ICD-10-CM

## 2017-12-07 LAB — POCT URINALYSIS DIPSTICK
Bilirubin, UA: NEGATIVE
Glucose, UA: NEGATIVE
Nitrite, UA: NEGATIVE
Protein, UA: NEGATIVE
SPEC GRAV UA: 1.01 (ref 1.010–1.025)
UROBILINOGEN UA: 0.2 U/dL
pH, UA: 5 (ref 5.0–8.0)

## 2017-12-07 MED ORDER — NITROFURANTOIN MONOHYD MACRO 100 MG PO CAPS: 100.0000 mg | ORAL_CAPSULE | Freq: Two times a day (BID) | ORAL | 0 refills | Status: AC

## 2017-12-07 NOTE — Patient Instructions (Addendum)

## 2017-12-07 NOTE — Progress Notes (Signed)
Subjective:    Rachel Norman is a 52 y.o. female who complains of frequency and low back pain for since this morning. Patient also complains of back pain. Patient denies congestion, cough, fever, headache, sorethroat, stomach ache and vaginal discharge.  Patient does not have a history of recurrent UTI.  Patient does not have a history of pyelonephritis. The following portions of the patient's history were reviewed and updated as appropriate: allergies, current medications and past medical history. Review of Systems Constitutional: negative Eyes: negative Ears, nose, mouth, throat, and face: negative Respiratory: negative Cardiovascular: negative Gastrointestinal: negative for suprapubic pressure Genitourinary:positive for frequency and urinary incontinence, negative for sexual problems and vaginal discharge, dysuria, hematuria and nocturia    Objective:    There were no vitals taken for this visit. General: alert, cooperative and no distress  Abdomen: soft, nondistended, normal bowel sounds, active bowel sounds and tenderness mild in the lower abdomen pelvic region  Back: back muscles are full ROM, CVA tenderness absent  GU: defer exam   Laboratory:  Urine dipstick shows sp gravity 1.000, negative for glucose, moderate for hemoglobin, trace for ketones, moderate for leukocyte esterase, negative for nitrites, negative for protein and 0.2 for urobilinogen.   Micro exam: not done.    Assessment:    Acute cystitis    Plan: Plan:    1. Medications: nitrofurantoin 2. Maintain adequate hydration 3. Follow up if symptoms not improving, and prn.

## 2018-01-10 DIAGNOSIS — M25521 Pain in right elbow: Secondary | ICD-10-CM | POA: Diagnosis not present

## 2018-05-03 ENCOUNTER — Other Ambulatory Visit: Payer: Self-pay | Admitting: Radiology

## 2018-05-03 DIAGNOSIS — N6320 Unspecified lump in the left breast, unspecified quadrant: Secondary | ICD-10-CM

## 2018-05-07 ENCOUNTER — Other Ambulatory Visit: Payer: 59

## 2018-05-08 ENCOUNTER — Ambulatory Visit
Admission: RE | Admit: 2018-05-08 | Discharge: 2018-05-08 | Disposition: A | Discharge status: Home / Self Care | Payer: 59 | Source: Ambulatory Visit | Attending: Radiology | Admitting: Radiology

## 2018-05-08 DIAGNOSIS — R922 Inconclusive mammogram: Secondary | ICD-10-CM | POA: Diagnosis not present

## 2018-05-08 DIAGNOSIS — N6320 Unspecified lump in the left breast, unspecified quadrant: Secondary | ICD-10-CM

## 2018-05-08 DIAGNOSIS — N6012 Diffuse cystic mastopathy of left breast: Secondary | ICD-10-CM | POA: Diagnosis not present

## 2018-06-19 DIAGNOSIS — Z136 Encounter for screening for cardiovascular disorders: Secondary | ICD-10-CM | POA: Diagnosis not present

## 2018-06-19 DIAGNOSIS — Z0000 Encounter for general adult medical examination without abnormal findings: Secondary | ICD-10-CM | POA: Diagnosis not present

## 2018-06-19 DIAGNOSIS — Z131 Encounter for screening for diabetes mellitus: Secondary | ICD-10-CM | POA: Diagnosis not present

## 2018-07-23 DIAGNOSIS — Z6824 Body mass index (BMI) 24.0-24.9, adult: Secondary | ICD-10-CM | POA: Diagnosis not present

## 2018-07-23 DIAGNOSIS — Z01419 Encounter for gynecological examination (general) (routine) without abnormal findings: Secondary | ICD-10-CM | POA: Diagnosis not present

## 2018-08-11 DIAGNOSIS — M7061 Trochanteric bursitis, right hip: Secondary | ICD-10-CM | POA: Diagnosis not present

## 2018-08-28 ENCOUNTER — Other Ambulatory Visit: Payer: Self-pay

## 2018-08-28 ENCOUNTER — Ambulatory Visit: Payer: 59 | Attending: Obstetrics and Gynecology | Admitting: Physical Therapy

## 2018-08-28 DIAGNOSIS — M6281 Muscle weakness (generalized): Secondary | ICD-10-CM | POA: Insufficient documentation

## 2018-08-28 DIAGNOSIS — R279 Unspecified lack of coordination: Secondary | ICD-10-CM | POA: Diagnosis not present

## 2018-08-28 DIAGNOSIS — M5441 Lumbago with sciatica, right side: Secondary | ICD-10-CM | POA: Insufficient documentation
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
## 2018-08-28 NOTE — Patient Instructions (Signed)

## 2018-09-11 ENCOUNTER — Ambulatory Visit: Payer: 59 | Admitting: Physical Therapy

## 2018-09-11 DIAGNOSIS — M6281 Muscle weakness (generalized): Secondary | ICD-10-CM | POA: Diagnosis not present

## 2018-09-11 DIAGNOSIS — R279 Unspecified lack of coordination: Secondary | ICD-10-CM | POA: Diagnosis not present

## 2018-09-11 DIAGNOSIS — M5441 Lumbago with sciatica, right side: Secondary | ICD-10-CM
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
## 2018-09-11 NOTE — Patient Instructions (Signed)
Access Code: [email or web address]  URL: [email or web address]  Date: 09/11/2018  Prepared by: Lovett Calender   Exercises  Seated Hamstring Stretch - 3 reps - 1 sets - 30 sec hold - 1x daily - 7x weekly  Seated Piriformis Stretch - 3 reps - 1 sets - 30 sec hold - 1x daily - 7x weekly  Supine Pelvic Floor Contraction - 10 reps - 1 sets - 1x daily - 7x weekly  Supine Bridge with Pelvic Floor Contraction and Hip Rotation - 10 reps - 1 sets - 5 sec hold - 2x daily - 7x weekly  Hooklying clam with kegel - 10 reps - 3 sets - 1x daily - 7x weekly

## 2018-09-16 ENCOUNTER — Ambulatory Visit: Payer: 59 | Admitting: Physical Therapy

## 2018-09-16 DIAGNOSIS — R279 Unspecified lack of coordination: Secondary | ICD-10-CM | POA: Diagnosis not present

## 2018-09-16 DIAGNOSIS — M6281 Muscle weakness (generalized): Secondary | ICD-10-CM

## 2018-09-16 DIAGNOSIS — M5441 Lumbago with sciatica, right side: Secondary | ICD-10-CM
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
## 2018-09-25 ENCOUNTER — Encounter: Payer: 59 | Admitting: Physical Therapy

## 2018-10-02 ENCOUNTER — Ambulatory Visit: Payer: 59 | Attending: Obstetrics and Gynecology | Admitting: Physical Therapy

## 2018-10-02 DIAGNOSIS — M6281 Muscle weakness (generalized): Secondary | ICD-10-CM | POA: Insufficient documentation

## 2018-10-02 DIAGNOSIS — R279 Unspecified lack of coordination: Secondary | ICD-10-CM | POA: Diagnosis not present

## 2018-10-02 DIAGNOSIS — M5441 Lumbago with sciatica, right side: Secondary | ICD-10-CM | POA: Insufficient documentation
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
## 2018-10-09 ENCOUNTER — Ambulatory Visit: Payer: 59 | Admitting: Physical Therapy

## 2018-10-10 DIAGNOSIS — H5213 Myopia, bilateral: Secondary | ICD-10-CM | POA: Diagnosis not present

## 2018-10-14 ENCOUNTER — Ambulatory Visit: Payer: 59 | Admitting: Physical Therapy

## 2018-10-14 DIAGNOSIS — M6281 Muscle weakness (generalized): Secondary | ICD-10-CM

## 2018-10-14 DIAGNOSIS — M5441 Lumbago with sciatica, right side: Secondary | ICD-10-CM | POA: Diagnosis not present

## 2018-10-14 DIAGNOSIS — R279 Unspecified lack of coordination: Secondary | ICD-10-CM
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
## 2018-10-14 NOTE — Patient Instructions (Signed)
Access Code: [email or web address]  URL: [email or web address]  Date: 10/14/2018  Prepared by: Lovett Calender   Exercises  Seated Hamstring Stretch - 3 reps - 1 sets - 30 sec hold - 1x daily - 7x weekly  Seated Piriformis Stretch - 3 reps - 1 sets - 30 sec hold - 1x daily - 7x weekly  Supine Bridge with Mini Swiss Ball Between Knees - 10 reps - 3 sets - 5 sec hold - 1x daily - 7x weekly  Standing Hip Extension with Anchored Resistance - 10 reps - 1 sets - 1x daily - 7x weekly  Prone Pelvic Floor Contraction with Heel Squeeze - 10 reps - 2 sets - 5 sec hold - 1x daily - 7x weekly  Standing Hip Flexion with Resistance Loop - 10 reps - 1 sets - 1x daily - 7x weekly  Standing Hip Abduction with Anchored Resistance - 10 reps - 1 sets - 1x daily - 7x weekly  Standing Hip Adduction with Anchored Resistance - 10 reps - 1 sets - 1x daily - 7x weekly

## 2018-10-21 ENCOUNTER — Encounter: Payer: Self-pay | Admitting: Physical Therapy

## 2018-10-21 ENCOUNTER — Ambulatory Visit: Payer: 59 | Admitting: Physical Therapy

## 2018-10-21 DIAGNOSIS — M5441 Lumbago with sciatica, right side: Secondary | ICD-10-CM | POA: Diagnosis not present

## 2018-10-21 DIAGNOSIS — R279 Unspecified lack of coordination: Secondary | ICD-10-CM | POA: Diagnosis not present

## 2018-10-21 DIAGNOSIS — M6281 Muscle weakness (generalized): Secondary | ICD-10-CM | POA: Diagnosis not present
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
## 2018-10-25 MED FILL — PEG-3350 SOLUTION: 420 | 1 days supply | Qty: 4000 | Fill #0

## 2018-11-01 DIAGNOSIS — K635 Polyp of colon: Secondary | ICD-10-CM | POA: Diagnosis not present

## 2018-11-01 DIAGNOSIS — Z1211 Encounter for screening for malignant neoplasm of colon: Secondary | ICD-10-CM | POA: Diagnosis not present

## 2018-11-13 ENCOUNTER — Encounter: Payer: Self-pay | Admitting: Physical Therapy

## 2018-12-31 DIAGNOSIS — L814 Other melanin hyperpigmentation: Secondary | ICD-10-CM | POA: Diagnosis not present

## 2018-12-31 DIAGNOSIS — L821 Other seborrheic keratosis: Secondary | ICD-10-CM | POA: Diagnosis not present

## 2018-12-31 DIAGNOSIS — D225 Melanocytic nevi of trunk: Secondary | ICD-10-CM | POA: Diagnosis not present

## 2018-12-31 DIAGNOSIS — Z23 Encounter for immunization: Secondary | ICD-10-CM | POA: Diagnosis not present

## 2019-04-21 DIAGNOSIS — M67449 Ganglion, unspecified hand: Secondary | ICD-10-CM | POA: Diagnosis not present

## 2019-05-19 DIAGNOSIS — Z20828 Contact with and (suspected) exposure to other viral communicable diseases: Secondary | ICD-10-CM | POA: Diagnosis not present

## 2019-05-30 ENCOUNTER — Other Ambulatory Visit: Payer: Self-pay | Admitting: Obstetrics and Gynecology

## 2019-05-30 DIAGNOSIS — Z1231 Encounter for screening mammogram for malignant neoplasm of breast: Secondary | ICD-10-CM

## 2019-06-03 ENCOUNTER — Telehealth: Payer: 59 | Admitting: Family

## 2019-06-03 DIAGNOSIS — B354 Tinea corporis: Secondary | ICD-10-CM

## 2019-06-03 MED ORDER — NYSTATIN 100000 UNIT/GM EX CREA: 1.0000 "application " | TOPICAL_CREAM | Freq: Two times a day (BID) | CUTANEOUS | 0 refills | Status: DC

## 2019-06-03 MED ORDER — FLUCONAZOLE 150 MG PO TABS: 150.0000 mg | ORAL_TABLET | ORAL | 0 refills | Status: DC

## 2019-06-03 MED FILL — FLUCONAZOLE 150 MG TABS: 150 | 28 days supply | Qty: 4 | Fill #0

## 2019-06-03 MED FILL — NYSTATIN 100,000 UNIT/GM CR: 100000 | 7 days supply | Qty: 30 | Fill #0

## 2019-06-03 NOTE — Progress Notes (Signed)
E Visit for Rash  We are sorry that you are not feeling well. Here is how we plan to help!  Based upon your presentation it appears you have a fungal infection.  I have prescribed: and Nystatin cream apply to the affected area twice daily . Because the rash is widespread, I have sent in Diflucan to be taken once weekly.    HOME CARE:   Take cool showers and avoid direct sunlight.  Apply cool compress or wet dressings.  Take a bath in an oatmeal bath.  Sprinkle content of one Aveeno packet under running faucet with comfortably warm water.  Bathe for 15-20 minutes, 1-2 times daily.  Pat dry with a towel. Do not rub the rash.  Use hydrocortisone cream.  Take an antihistamine like Benadryl for widespread rashes that itch.  The adult dose of Benadryl is 25-50 mg by mouth 4 times daily.  Caution:  This type of medication may cause sleepiness.  Do not drink alcohol, drive, or operate dangerous machinery while taking antihistamines.  Do not take these medications if you have prostate enlargement.  Read package instructions thoroughly on all medications that you take.  GET HELP RIGHT AWAY IF:   Symptoms don't go away after treatment.  Severe itching that persists.  If you rash spreads or swells.  If you rash begins to smell.  If it blisters and opens or develops a yellow-brown crust.  You develop a fever.  You have a sore throat.  You become short of breath.  MAKE SURE YOU:  Understand these instructions. Will watch your condition. Will get help right away if you are not doing well or get worse.  Thank you for choosing an e-visit. Your e-visit answers were reviewed by a board certified advanced clinical practitioner to complete your personal care plan. Depending upon the condition, your plan could have included both over the counter or prescription medications. Please review your pharmacy choice. Be sure that the pharmacy you have chosen is open so that you can pick up your  prescription now.  If there is a problem you may message your provider in [place] to have the prescription routed to another pharmacy. Your safety is important to [place]. If you have drug allergies check your prescription carefully.  For the next 24 hours, you can use MyChart to ask questions about today's visit, request a non-urgent call back, or ask for a work or school excuse from your e-visit provider. You will get an email in the next two days asking about your experience. I hope that your e-visit has been valuable and will speed your recovery.    Greater than 5 minutes, yet less than 10 minutes of time have been spent researching, coordinating, and implementing care for this patient today.  Thank you for the details you included in the comment boxes. Those details are very helpful in determining the best course of treatment for you and help [place] to provide the best care.

## 2019-06-04 DIAGNOSIS — R2231 Localized swelling, mass and lump, right upper limb: Secondary | ICD-10-CM | POA: Diagnosis not present

## 2019-06-04 DIAGNOSIS — R2232 Localized swelling, mass and lump, left upper limb: Secondary | ICD-10-CM | POA: Diagnosis not present

## 2019-06-04 DIAGNOSIS — M79641 Pain in right hand: Secondary | ICD-10-CM | POA: Diagnosis not present

## 2019-06-23 DIAGNOSIS — Z0000 Encounter for general adult medical examination without abnormal findings: Secondary | ICD-10-CM | POA: Diagnosis not present

## 2019-06-23 DIAGNOSIS — Z13220 Encounter for screening for lipoid disorders: Secondary | ICD-10-CM | POA: Diagnosis not present

## 2019-06-23 DIAGNOSIS — R51 Headache: Secondary | ICD-10-CM | POA: Diagnosis not present

## 2019-06-25 ENCOUNTER — Other Ambulatory Visit: Payer: Self-pay

## 2019-06-25 ENCOUNTER — Ambulatory Visit
Admission: RE | Admit: 2019-06-25 | Discharge: 2019-06-25 | Disposition: A | Discharge status: Home / Self Care | Payer: 59 | Source: Ambulatory Visit

## 2019-06-25 DIAGNOSIS — Z1231 Encounter for screening mammogram for malignant neoplasm of breast: Secondary | ICD-10-CM | POA: Diagnosis not present

## 2019-07-08 ENCOUNTER — Telehealth: Payer: 59 | Admitting: Physician Assistant

## 2019-07-08 DIAGNOSIS — B354 Tinea corporis: Secondary | ICD-10-CM

## 2019-07-08 MED ORDER — TERBINAFINE HCL 1 % EX CREA: 1.0000 "application " | TOPICAL_CREAM | Freq: Two times a day (BID) | CUTANEOUS | 0 refills | Status: DC

## 2019-07-08 MED ORDER — FLUCONAZOLE 150 MG PO TABS: 150.0000 mg | ORAL_TABLET | ORAL | 0 refills | Status: DC

## 2019-07-08 MED FILL — FLUCONAZOLE 150 MG TABLET: 150 | 28 days supply | Qty: 4 | Fill #0

## 2019-07-08 NOTE — Progress Notes (Signed)
E Visit for Rash  We are sorry that you are not feeling well. Here is how we plan to help!  Based upon your presentation it appears you have a fungal infection.  I have prescribed:  Diflucan 150 mg taken by mouth once/week. Terbinafine cream applied topically to affected areas 1-2 times daily for 1-3 weeks.    HOME CARE:   Take cool showers and avoid direct sunlight.  Apply cool compress or wet dressings.  Take a bath in an oatmeal bath.  Sprinkle content of one Aveeno packet under running faucet with comfortably warm water.  Bathe for 15-20 minutes, 1-2 times daily.  Pat dry with a towel. Do not rub the rash.  Use hydrocortisone cream.  Take an antihistamine like Benadryl for widespread rashes that itch.  The adult dose of Benadryl is 25-50 mg by mouth 4 times daily.  Caution:  This type of medication may cause sleepiness.  Do not drink alcohol, drive, or operate dangerous machinery while taking antihistamines.  Do not take these medications if you have prostate enlargement.  Read package instructions thoroughly on all medications that you take.  GET HELP RIGHT AWAY IF:   Symptoms don't go away after treatment.  Severe itching that persists.  If you rash spreads or swells.  If you rash begins to smell.  If it blisters and opens or develops a yellow-brown crust.  You develop a fever.  You have a sore throat.  You become short of breath.  MAKE SURE YOU:  Understand these instructions. Will watch your condition. Will get help right away if you are not doing well or get worse.  Thank you for choosing an e-visit. Your e-visit answers were reviewed by a board certified advanced clinical practitioner to complete your personal care plan. Depending upon the condition, your plan could have included both over the counter or prescription medications. Please review your pharmacy choice. Be sure that the pharmacy you have chosen is open so that you can pick up your prescription  now.  If there is a problem you may message your provider in [place] to have the prescription routed to another pharmacy. Your safety is important to [place]. If you have drug allergies check your prescription carefully.  For the next 24 hours, you can use MyChart to ask questions about today's visit, request a non-urgent call back, or ask for a work or school excuse from your e-visit provider. You will get an email in the next two days asking about your experience. I hope that your e-visit has been valuable and will speed your recovery.     Greater than 5 minutes, yet less than 10 minutes of time have been spent researching, coordinating and implementing care for this patient today.

## 2019-08-04 MED FILL — PENICILLIN VK 500 MG TABLET: 500 | 7 days supply | Qty: 28 | Fill #0

## 2019-08-26 DIAGNOSIS — Z9189 Other specified personal risk factors, not elsewhere classified: Secondary | ICD-10-CM | POA: Diagnosis not present

## 2019-08-26 DIAGNOSIS — Z01419 Encounter for gynecological examination (general) (routine) without abnormal findings: Secondary | ICD-10-CM | POA: Diagnosis not present

## 2019-08-26 DIAGNOSIS — Z6823 Body mass index (BMI) 23.0-23.9, adult: Secondary | ICD-10-CM | POA: Diagnosis not present

## 2019-08-28 ENCOUNTER — Other Ambulatory Visit (HOSPITAL_COMMUNITY): Payer: Self-pay | Admitting: Obstetrics and Gynecology

## 2019-08-28 ENCOUNTER — Other Ambulatory Visit: Payer: Self-pay | Admitting: Obstetrics and Gynecology

## 2019-08-28 DIAGNOSIS — Z9189 Other specified personal risk factors, not elsewhere classified: Secondary | ICD-10-CM

## 2019-09-05 ENCOUNTER — Encounter (HOSPITAL_COMMUNITY): Payer: Self-pay

## 2019-09-05 ENCOUNTER — Ambulatory Visit (HOSPITAL_COMMUNITY): Payer: 59

## 2019-11-17 IMAGING — MG MM DIGITAL SCREENING BILAT W/ TOMO W/ CAD
8 series · 8 of 24 positions shown · non-contrast
Comparison: Previous exam(s).

CLINICAL DATA: Screening.

EXAM:
DIGITAL SCREENING BILATERAL MAMMOGRAM WITH TOMO AND CAD

[R CC synth-2D]
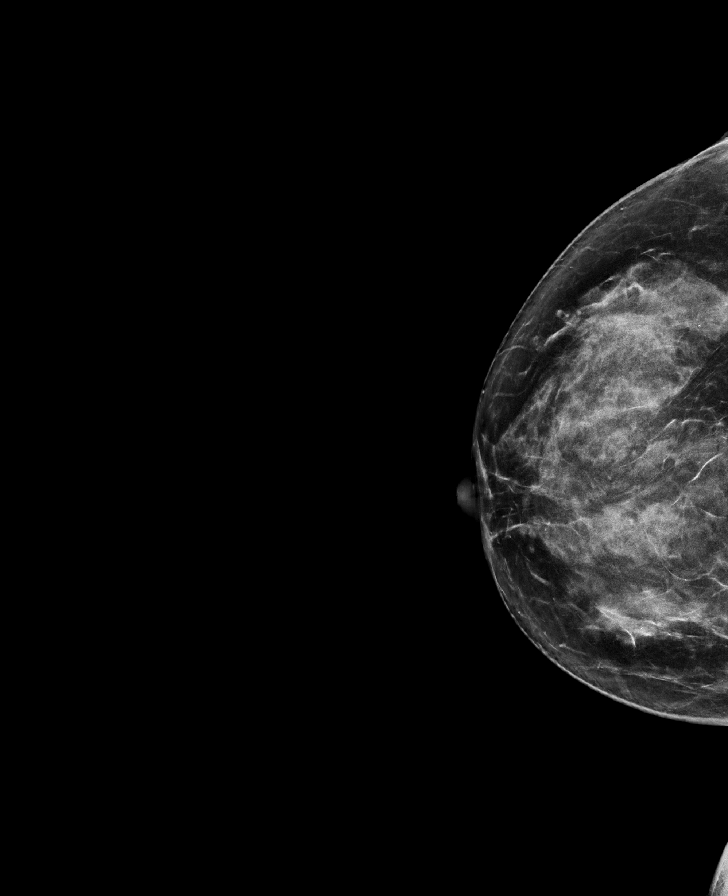

[L MLO synth-2D]
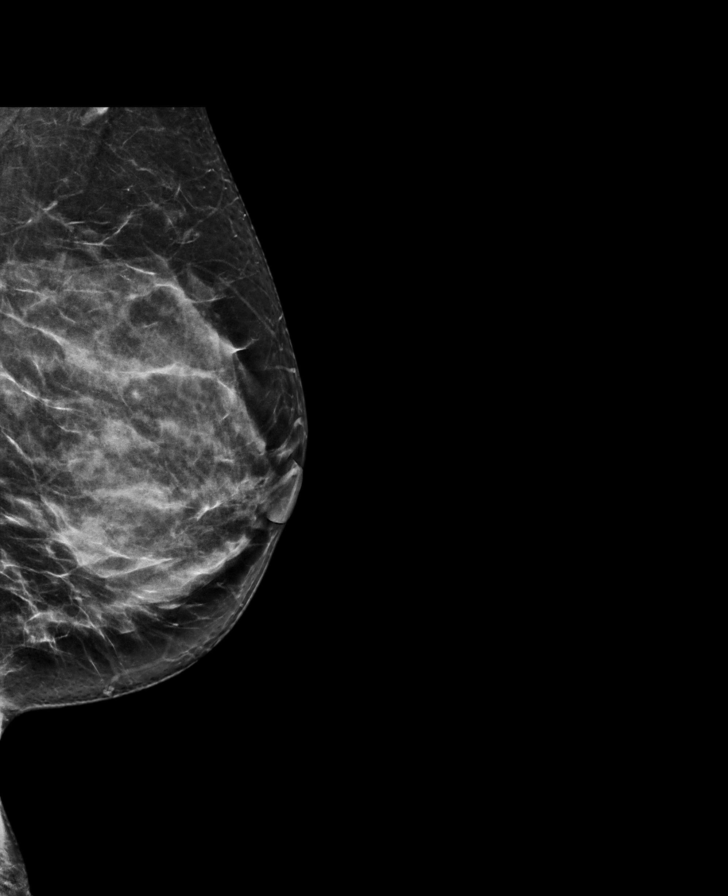

[L CC synth-2D]
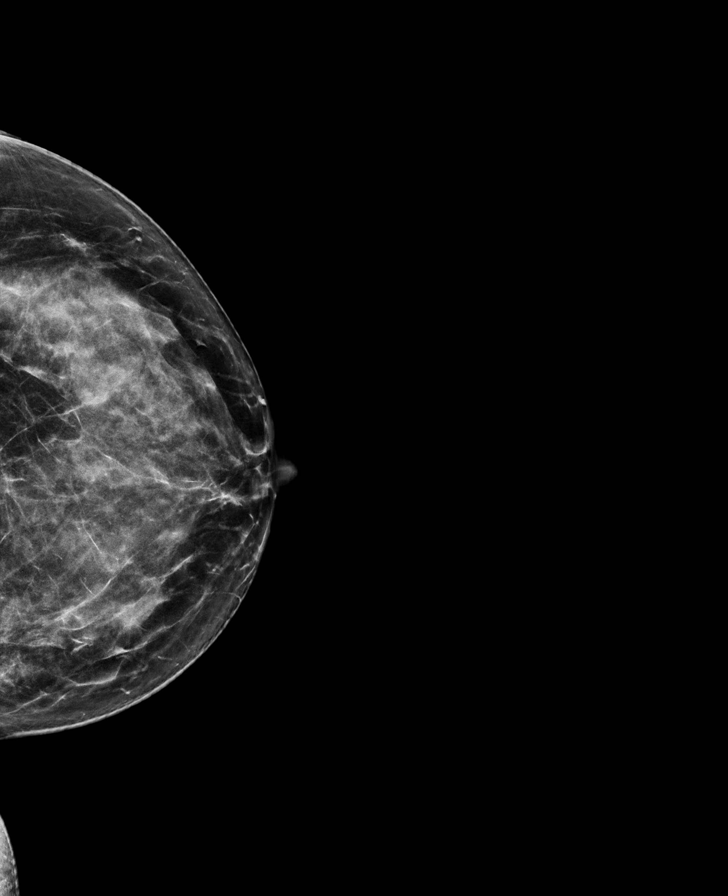

[R MLO synth-2D]
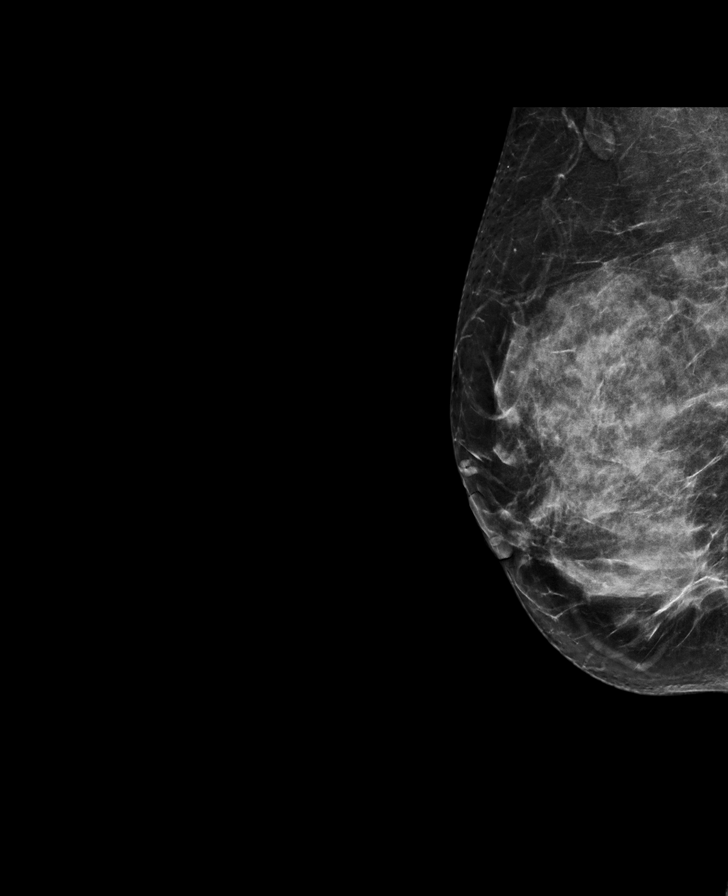

[R MLO tomo · tomo slice 35/70.0]
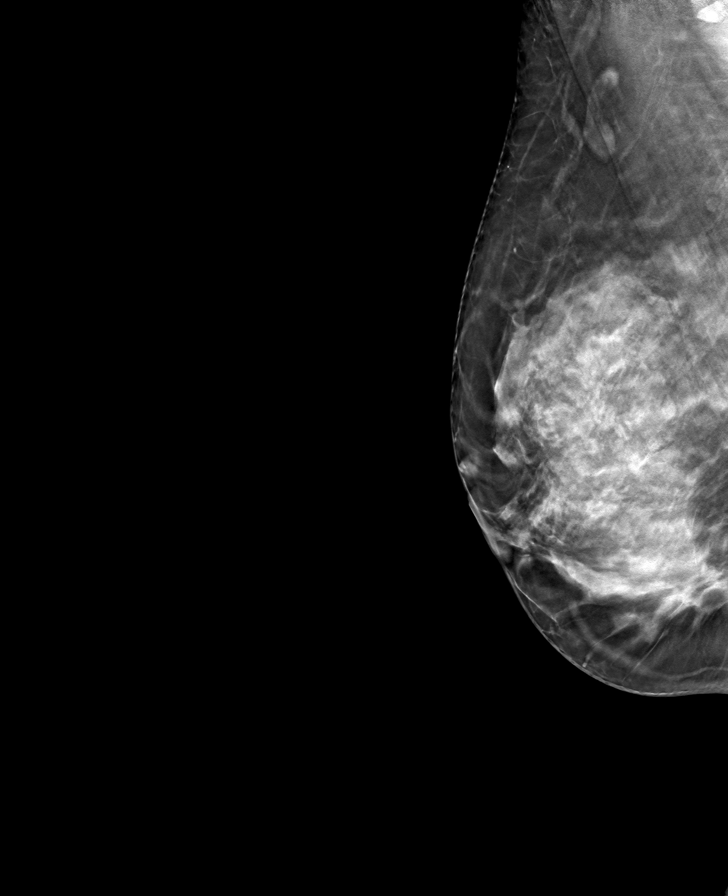

[R CC tomo · tomo slice 36/71.0]
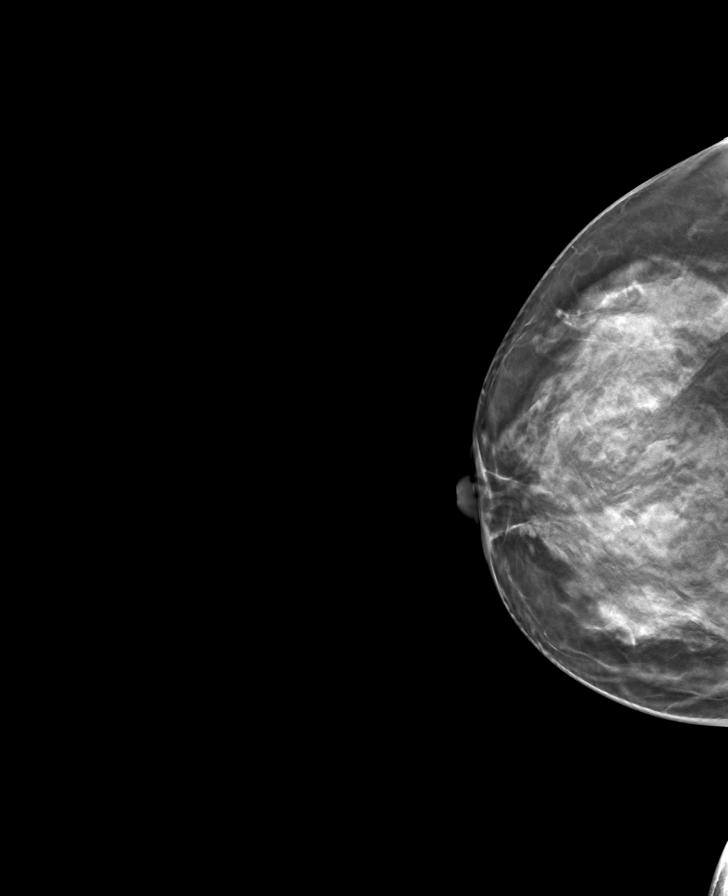

[L CC tomo · tomo slice 34/67.0]
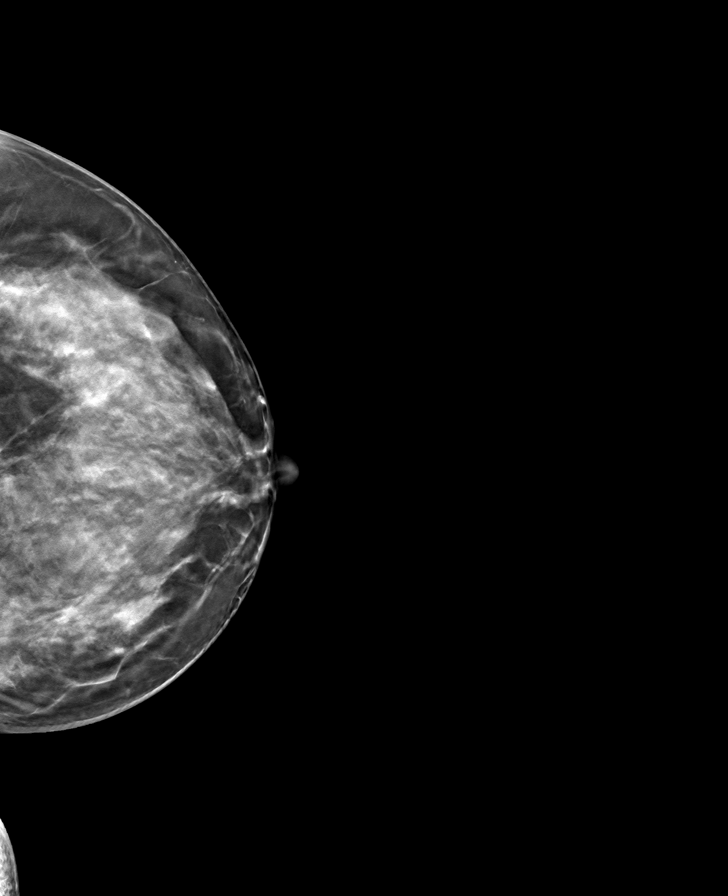

[L MLO tomo · tomo slice 34/67.0]
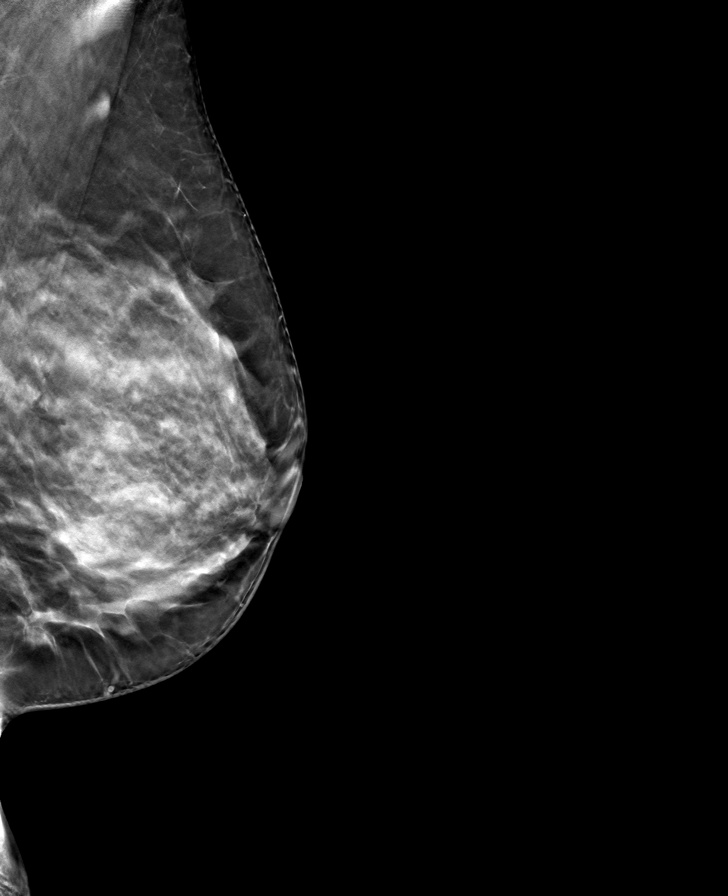

[8 of 24 positions shown; findings below may reference images not displayed]

ACR Breast Density Category d: The breast tissue is extremely dense,
which lowers the sensitivity of mammography
FINDINGS: There are no findings suspicious for malignancy. Images were
processed with CAD.
IMPRESSION: No mammographic evidence of malignancy. A result letter of this
screening mammogram will be mailed directly to the patient.

RECOMMENDATION:
Screening mammogram in one year. (Code:WO-0-ZI0)

BI-RADS CATEGORY  1: Negative.

## 2019-11-19 ENCOUNTER — Ambulatory Visit (HOSPITAL_COMMUNITY): Payer: 59

## 2019-11-19 ENCOUNTER — Encounter (HOSPITAL_COMMUNITY): Payer: Self-pay

## 2019-12-19 ENCOUNTER — Other Ambulatory Visit: Payer: Self-pay | Admitting: Obstetrics and Gynecology

## 2019-12-19 DIAGNOSIS — Z9189 Other specified personal risk factors, not elsewhere classified: Secondary | ICD-10-CM

## 2020-01-02 ENCOUNTER — Ambulatory Visit
Admission: RE | Admit: 2020-01-02 | Discharge: 2020-01-02 | Disposition: A | Discharge status: Home / Self Care | Payer: 59 | Source: Ambulatory Visit | Attending: Obstetrics and Gynecology | Admitting: Obstetrics and Gynecology

## 2020-01-02 DIAGNOSIS — Z9189 Other specified personal risk factors, not elsewhere classified: Secondary | ICD-10-CM

## 2020-01-02 DIAGNOSIS — N6489 Other specified disorders of breast: Secondary | ICD-10-CM | POA: Diagnosis not present

## 2020-01-02 MED ORDER — GADOBUTROL 1 MMOL/ML IV SOLN
6.0000 mL | Freq: Once | INTRAVENOUS | Status: AC | PRN
  Administered 2020-01-02: 6 mL via INTRAVENOUS

## 2020-04-28 DIAGNOSIS — J029 Acute pharyngitis, unspecified: Secondary | ICD-10-CM | POA: Diagnosis not present

## 2020-04-28 MED FILL — AMOXICILLIN 875 MG TABS: 875 | 10 days supply | Qty: 20 | Fill #0

## 2020-04-29 DIAGNOSIS — H524 Presbyopia: Secondary | ICD-10-CM | POA: Diagnosis not present

## 2020-04-30 DIAGNOSIS — Z03818 Encounter for observation for suspected exposure to other biological agents ruled out: Secondary | ICD-10-CM | POA: Diagnosis not present

## 2020-04-30 DIAGNOSIS — R05 Cough: Secondary | ICD-10-CM | POA: Diagnosis not present

## 2020-04-30 DIAGNOSIS — J069 Acute upper respiratory infection, unspecified: Secondary | ICD-10-CM | POA: Diagnosis not present

## 2020-04-30 DIAGNOSIS — H66011 Acute suppurative otitis media with spontaneous rupture of ear drum, right ear: Secondary | ICD-10-CM | POA: Diagnosis not present

## 2020-05-04 DIAGNOSIS — H6501 Acute serous otitis media, right ear: Secondary | ICD-10-CM | POA: Diagnosis not present

## 2020-05-04 DIAGNOSIS — H90A31 Mixed conductive and sensorineural hearing loss, unilateral, right ear with restricted hearing on the contralateral side: Secondary | ICD-10-CM | POA: Diagnosis not present

## 2020-05-18 DIAGNOSIS — H6501 Acute serous otitis media, right ear: Secondary | ICD-10-CM | POA: Diagnosis not present

## 2020-05-18 DIAGNOSIS — H90A31 Mixed conductive and sensorineural hearing loss, unilateral, right ear with restricted hearing on the contralateral side: Secondary | ICD-10-CM | POA: Diagnosis not present

## 2020-05-23 DIAGNOSIS — W540XXA Bitten by dog, initial encounter: Secondary | ICD-10-CM | POA: Diagnosis not present

## 2020-05-23 DIAGNOSIS — S41011A Laceration without foreign body of right shoulder, initial encounter: Secondary | ICD-10-CM | POA: Diagnosis not present

## 2020-05-23 DIAGNOSIS — Z792 Long term (current) use of antibiotics: Secondary | ICD-10-CM | POA: Diagnosis not present

## 2020-07-02 DIAGNOSIS — S41011D Laceration without foreign body of right shoulder, subsequent encounter: Secondary | ICD-10-CM | POA: Diagnosis not present

## 2020-07-02 DIAGNOSIS — Z0000 Encounter for general adult medical examination without abnormal findings: Secondary | ICD-10-CM | POA: Diagnosis not present

## 2020-07-02 DIAGNOSIS — Z23 Encounter for immunization: Secondary | ICD-10-CM | POA: Diagnosis not present

## 2020-07-02 DIAGNOSIS — Z13220 Encounter for screening for lipoid disorders: Secondary | ICD-10-CM | POA: Diagnosis not present

## 2020-07-02 DIAGNOSIS — W540XXS Bitten by dog, sequela: Secondary | ICD-10-CM | POA: Diagnosis not present

## 2020-07-02 DIAGNOSIS — S41011S Laceration without foreign body of right shoulder, sequela: Secondary | ICD-10-CM | POA: Diagnosis not present

## 2020-08-20 ENCOUNTER — Ambulatory Visit
Admission: RE | Admit: 2020-08-20 | Discharge: 2020-08-20 | Disposition: A | Discharge status: Home / Self Care | Payer: 59 | Source: Ambulatory Visit

## 2020-08-20 DIAGNOSIS — Z1231 Encounter for screening mammogram for malignant neoplasm of breast: Secondary | ICD-10-CM | POA: Diagnosis not present

## 2020-08-23 ENCOUNTER — Other Ambulatory Visit: Payer: Self-pay

## 2020-08-23 ENCOUNTER — Other Ambulatory Visit: Payer: Self-pay | Admitting: Family Medicine

## 2020-08-23 DIAGNOSIS — Z1231 Encounter for screening mammogram for malignant neoplasm of breast: Secondary | ICD-10-CM

## 2020-08-31 DIAGNOSIS — Z1231 Encounter for screening mammogram for malignant neoplasm of breast: Secondary | ICD-10-CM

## 2020-09-21 DIAGNOSIS — L821 Other seborrheic keratosis: Secondary | ICD-10-CM | POA: Diagnosis not present

## 2020-09-21 DIAGNOSIS — D225 Melanocytic nevi of trunk: Secondary | ICD-10-CM | POA: Diagnosis not present

## 2020-09-21 DIAGNOSIS — L820 Inflamed seborrheic keratosis: Secondary | ICD-10-CM | POA: Diagnosis not present

## 2020-09-21 DIAGNOSIS — L814 Other melanin hyperpigmentation: Secondary | ICD-10-CM | POA: Diagnosis not present

## 2020-09-21 DIAGNOSIS — L578 Other skin changes due to chronic exposure to nonionizing radiation: Secondary | ICD-10-CM | POA: Diagnosis not present

## 2020-09-30 DIAGNOSIS — Z01419 Encounter for gynecological examination (general) (routine) without abnormal findings: Secondary | ICD-10-CM | POA: Diagnosis not present

## 2021-05-12 ENCOUNTER — Other Ambulatory Visit (HOSPITAL_COMMUNITY): Payer: Self-pay

## 2021-05-12 DIAGNOSIS — N951 Menopausal and female climacteric states: Secondary | ICD-10-CM | POA: Diagnosis not present

## 2021-05-12 MED ORDER — PROGESTERONE MICRONIZED 100 MG PO CAPS
100.0000 mg | ORAL_CAPSULE | Freq: Every day | ORAL | 4 refills | Status: DC
  Filled 2021-05-12: qty 90, 90d supply, fill #0
  Filled 2021-08-16: qty 90, 90d supply, fill #1

## 2021-05-12 MED ORDER — DIVIGEL 0.75 MG/0.75GM TD GEL
0.7500 mg | Freq: Every day | TRANSDERMAL | 11 refills | Status: DC
  Filled 2021-05-12 – 2021-05-13 (×2): qty 30, 30d supply, fill #0

## 2021-05-12 MED ORDER — ESTROGEL 0.75 MG/1.25 GM (0.06%) TD GEL
1.2500 g | Freq: Every day | TRANSDERMAL | 11 refills | Status: DC
  Filled 2021-05-12: qty 50, 50d supply, fill #0

## 2021-05-13 ENCOUNTER — Other Ambulatory Visit (HOSPITAL_COMMUNITY): Payer: Self-pay

## 2021-05-17 ENCOUNTER — Other Ambulatory Visit (HOSPITAL_COMMUNITY): Payer: Self-pay

## 2021-05-17 MED ORDER — ESTRADIOL 0.05 MG/24HR TD PTTW
MEDICATED_PATCH | TRANSDERMAL | 5 refills | Status: DC
  Filled 2021-05-17: qty 8, 28d supply, fill #0

## 2021-05-19 ENCOUNTER — Other Ambulatory Visit (HOSPITAL_COMMUNITY): Payer: Self-pay

## 2021-05-30 ENCOUNTER — Other Ambulatory Visit (HOSPITAL_COMMUNITY): Payer: Self-pay

## 2021-05-30 MED ORDER — CARESTART COVID-19 HOME TEST VI KIT
PACK | 0 refills | Status: DC
  Filled 2021-05-30: qty 4, 4d supply, fill #0

## 2021-06-17 ENCOUNTER — Other Ambulatory Visit (HOSPITAL_COMMUNITY): Payer: Self-pay

## 2021-06-30 ENCOUNTER — Ambulatory Visit: Payer: 59 | Attending: Family Medicine | Admitting: Physical Therapy

## 2021-06-30 ENCOUNTER — Other Ambulatory Visit: Payer: Self-pay

## 2021-06-30 ENCOUNTER — Encounter: Payer: Self-pay | Admitting: Physical Therapy

## 2021-06-30 DIAGNOSIS — M545 Low back pain, unspecified: Secondary | ICD-10-CM | POA: Insufficient documentation

## 2021-06-30 DIAGNOSIS — M6281 Muscle weakness (generalized): Secondary | ICD-10-CM | POA: Insufficient documentation

## 2021-06-30 DIAGNOSIS — G8929 Other chronic pain: Secondary | ICD-10-CM | POA: Diagnosis not present
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
## 2021-07-04 DIAGNOSIS — N951 Menopausal and female climacteric states: Secondary | ICD-10-CM | POA: Diagnosis not present

## 2021-07-04 DIAGNOSIS — Z131 Encounter for screening for diabetes mellitus: Secondary | ICD-10-CM | POA: Diagnosis not present

## 2021-07-04 DIAGNOSIS — Z13220 Encounter for screening for lipoid disorders: Secondary | ICD-10-CM | POA: Diagnosis not present

## 2021-07-04 DIAGNOSIS — Z0000 Encounter for general adult medical examination without abnormal findings: Secondary | ICD-10-CM | POA: Diagnosis not present

## 2021-07-07 ENCOUNTER — Ambulatory Visit: Payer: 59 | Admitting: Physical Therapy

## 2021-07-07 ENCOUNTER — Encounter: Payer: Self-pay | Admitting: Physical Therapy

## 2021-07-07 ENCOUNTER — Other Ambulatory Visit: Payer: Self-pay

## 2021-07-07 DIAGNOSIS — M545 Low back pain, unspecified: Secondary | ICD-10-CM | POA: Diagnosis not present

## 2021-07-07 DIAGNOSIS — G8929 Other chronic pain: Secondary | ICD-10-CM

## 2021-07-07 DIAGNOSIS — M6281 Muscle weakness (generalized): Secondary | ICD-10-CM | POA: Diagnosis not present
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
## 2021-07-09 ENCOUNTER — Ambulatory Visit: Payer: 59 | Admitting: Physical Therapy

## 2021-07-15 ENCOUNTER — Ambulatory Visit: Payer: 59 | Admitting: Physical Therapy

## 2021-07-25 ENCOUNTER — Encounter: Payer: Self-pay | Admitting: Physical Therapy

## 2021-07-25 ENCOUNTER — Ambulatory Visit: Payer: 59 | Attending: Family Medicine | Admitting: Physical Therapy

## 2021-07-25 ENCOUNTER — Other Ambulatory Visit: Payer: Self-pay

## 2021-07-25 DIAGNOSIS — M6281 Muscle weakness (generalized): Secondary | ICD-10-CM | POA: Insufficient documentation

## 2021-07-25 DIAGNOSIS — G8929 Other chronic pain: Secondary | ICD-10-CM | POA: Diagnosis not present

## 2021-07-25 DIAGNOSIS — M545 Low back pain, unspecified: Secondary | ICD-10-CM | POA: Diagnosis not present
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
## 2021-07-28 ENCOUNTER — Ambulatory Visit: Payer: 59 | Admitting: Physical Therapy

## 2021-07-28 ENCOUNTER — Other Ambulatory Visit: Payer: Self-pay

## 2021-07-28 ENCOUNTER — Encounter: Payer: Self-pay | Admitting: Physical Therapy

## 2021-07-28 DIAGNOSIS — M6281 Muscle weakness (generalized): Secondary | ICD-10-CM | POA: Diagnosis not present

## 2021-07-28 DIAGNOSIS — G8929 Other chronic pain: Secondary | ICD-10-CM

## 2021-07-28 DIAGNOSIS — M545 Low back pain, unspecified: Secondary | ICD-10-CM | POA: Diagnosis not present
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
## 2021-08-01 ENCOUNTER — Ambulatory Visit: Payer: 59 | Admitting: Physical Therapy

## 2021-08-01 ENCOUNTER — Other Ambulatory Visit: Payer: Self-pay

## 2021-08-01 ENCOUNTER — Encounter: Payer: Self-pay | Admitting: Physical Therapy

## 2021-08-01 DIAGNOSIS — M6281 Muscle weakness (generalized): Secondary | ICD-10-CM | POA: Diagnosis not present

## 2021-08-01 DIAGNOSIS — G8929 Other chronic pain: Secondary | ICD-10-CM | POA: Diagnosis not present

## 2021-08-01 DIAGNOSIS — M545 Low back pain, unspecified: Secondary | ICD-10-CM
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
## 2021-08-04 ENCOUNTER — Ambulatory Visit: Payer: 59 | Admitting: Physical Therapy

## 2021-08-12 ENCOUNTER — Other Ambulatory Visit: Payer: Self-pay | Admitting: Obstetrics and Gynecology

## 2021-08-12 DIAGNOSIS — Z1231 Encounter for screening mammogram for malignant neoplasm of breast: Secondary | ICD-10-CM

## 2021-08-16 ENCOUNTER — Other Ambulatory Visit (HOSPITAL_COMMUNITY): Payer: Self-pay

## 2021-08-18 ENCOUNTER — Ambulatory Visit: Payer: 59 | Admitting: Physical Therapy

## 2021-08-18 ENCOUNTER — Encounter: Payer: Self-pay | Admitting: Physical Therapy

## 2021-08-18 ENCOUNTER — Other Ambulatory Visit: Payer: Self-pay

## 2021-08-18 DIAGNOSIS — M6281 Muscle weakness (generalized): Secondary | ICD-10-CM

## 2021-08-18 DIAGNOSIS — G8929 Other chronic pain: Secondary | ICD-10-CM

## 2021-08-18 DIAGNOSIS — M545 Low back pain, unspecified: Secondary | ICD-10-CM | POA: Diagnosis not present

## 2021-08-18 NOTE — Patient Instructions (Signed)
       Access Code: [email or web address]: [email or web address]: 10/27/2022Prepared by: Donnetta Simpers BeardsleyExercises   Standing Hip Hinge with Dowel - 1 x daily - 7 x weekly - 3 sets - 10 reps  Kettlebell Deadlift - 1 x daily - 7 x weekly - 3 sets - 8 reps - 30# hold  Reverse Lunge - 1 x daily - 7 x weekly - 2 sets - 12 reps - 3-5 hold
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
## 2021-08-24 ENCOUNTER — Other Ambulatory Visit: Payer: Self-pay

## 2021-08-24 ENCOUNTER — Encounter: Payer: Self-pay | Admitting: Physical Therapy

## 2021-08-24 ENCOUNTER — Ambulatory Visit: Payer: 59 | Attending: Family Medicine | Admitting: Physical Therapy

## 2021-08-24 DIAGNOSIS — M6281 Muscle weakness (generalized): Secondary | ICD-10-CM | POA: Diagnosis not present

## 2021-08-24 DIAGNOSIS — G8929 Other chronic pain: Secondary | ICD-10-CM | POA: Insufficient documentation

## 2021-08-24 DIAGNOSIS — M545 Low back pain, unspecified: Secondary | ICD-10-CM | POA: Insufficient documentation
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
## 2021-08-24 NOTE — Patient Instructions (Signed)
Access Code: [facility] URL: [email or web address] Date: 08/24/2021 Prepared by: Starr Lake  Exercises Sidelying Hip Abduction - 1 x daily - 7 x weekly - 3 sets - 15 reps Sidelying ITB Stretch off Table (Mirrored) - 2 x daily - 7 x weekly - 2 sets - 2 reps - 30 seconds hold Seated Hip Adduction Isometrics with Ball - 1 x daily - 7 x weekly - 2 sets - 10 reps Sidelying Hip Adduction (Mirrored) - 1 x daily - 7 x weekly - 2 sets - 15 reps Sidelying Hip External Rotation on Table - 1 x daily - 7 x weekly - 2 sets - 15 reps Sit to Stand with Resistance Around Legs - 1 x daily - 7 x weekly - 2 sets - 12 reps Side Stepping with Resistance at Ankles - 1 x daily - 7 x weekly - 3 sets - 10 reps Bridge with Hip Abduction and Resistance - Ground Touches - 1 x daily - 7 x weekly - 3 sets - 15 reps Standing Hip Hinge with Dowel - 1 x daily - 7 x weekly - 3 sets - 10 reps Kettlebell Deadlift - 1 x daily - 7 x weekly - 3 sets - 8 reps - 30# hold Reverse Lunge - 1 x daily - 7 x weekly - 2 sets - 15 reps - 3-5 hold Standing Hip Adduction with Resistance - 1 x daily - 7 x weekly - 2 sets - 15 reps Sidelying Reverse Clamshell - 1 x daily - 7 x weekly - 2 sets - 15 reps - 2-3 seconds hold Single Leg Stance - 1 x daily - 7 x weekly - 2 sets - 10 reps

## 2021-09-06 ENCOUNTER — Ambulatory Visit: Payer: 59 | Admitting: Physical Therapy

## 2021-09-06 ENCOUNTER — Other Ambulatory Visit: Payer: Self-pay

## 2021-09-06 DIAGNOSIS — G8929 Other chronic pain: Secondary | ICD-10-CM

## 2021-09-06 DIAGNOSIS — M6281 Muscle weakness (generalized): Secondary | ICD-10-CM

## 2021-09-06 DIAGNOSIS — M545 Low back pain, unspecified: Secondary | ICD-10-CM

## 2021-09-06 NOTE — Patient Instructions (Signed)
Access Code: [facility] URL: [email or web address] Date: 09/06/2021 Prepared by: Voncille Lo  Exercises  Child's Pose Stretch - 1 x daily - 7 x weekly - 1 sets - 3-5 reps - 30 sec hold
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
## 2021-09-08 ENCOUNTER — Ambulatory Visit: Payer: 59 | Admitting: Physical Therapy

## 2021-09-12 ENCOUNTER — Ambulatory Visit
Admission: RE | Admit: 2021-09-12 | Discharge: 2021-09-12 | Disposition: A | Discharge status: Home / Self Care | Payer: 59 | Source: Ambulatory Visit

## 2021-09-12 DIAGNOSIS — Z1231 Encounter for screening mammogram for malignant neoplasm of breast: Secondary | ICD-10-CM | POA: Diagnosis not present

## 2021-09-13 ENCOUNTER — Ambulatory Visit: Payer: 59 | Admitting: Physical Therapy

## 2021-09-20 ENCOUNTER — Encounter: Payer: 59 | Admitting: Physical Therapy

## 2021-09-20 DIAGNOSIS — Z23 Encounter for immunization: Secondary | ICD-10-CM | POA: Diagnosis not present

## 2021-09-20 DIAGNOSIS — D239 Other benign neoplasm of skin, unspecified: Secondary | ICD-10-CM | POA: Diagnosis not present

## 2021-09-20 DIAGNOSIS — L578 Other skin changes due to chronic exposure to nonionizing radiation: Secondary | ICD-10-CM | POA: Diagnosis not present

## 2021-09-20 DIAGNOSIS — L720 Epidermal cyst: Secondary | ICD-10-CM | POA: Diagnosis not present

## 2021-09-20 DIAGNOSIS — L814 Other melanin hyperpigmentation: Secondary | ICD-10-CM | POA: Diagnosis not present

## 2021-09-20 DIAGNOSIS — L821 Other seborrheic keratosis: Secondary | ICD-10-CM | POA: Diagnosis not present

## 2021-09-20 DIAGNOSIS — D1801 Hemangioma of skin and subcutaneous tissue: Secondary | ICD-10-CM | POA: Diagnosis not present

## 2021-09-22 ENCOUNTER — Encounter: Payer: 59 | Admitting: Physical Therapy

## 2021-10-19 DIAGNOSIS — H5213 Myopia, bilateral: Secondary | ICD-10-CM | POA: Diagnosis not present

## 2021-11-07 ENCOUNTER — Other Ambulatory Visit (HOSPITAL_COMMUNITY): Payer: Self-pay

## 2021-11-07 DIAGNOSIS — E8941 Symptomatic postprocedural ovarian failure: Secondary | ICD-10-CM | POA: Diagnosis not present

## 2021-11-07 DIAGNOSIS — Z01419 Encounter for gynecological examination (general) (routine) without abnormal findings: Secondary | ICD-10-CM | POA: Diagnosis not present

## 2021-11-07 DIAGNOSIS — N951 Menopausal and female climacteric states: Secondary | ICD-10-CM | POA: Diagnosis not present

## 2021-11-07 DIAGNOSIS — Z6824 Body mass index (BMI) 24.0-24.9, adult: Secondary | ICD-10-CM | POA: Diagnosis not present

## 2021-11-07 MED ORDER — ESTRADIOL 0.05 MG/24HR TD PTTW
1.0000 | MEDICATED_PATCH | TRANSDERMAL | 5 refills | Status: DC
  Filled 2021-11-07: qty 8, 28d supply, fill #0

## 2021-11-07 MED ORDER — PROGESTERONE MICRONIZED 100 MG PO CAPS
100.0000 mg | ORAL_CAPSULE | Freq: Every evening | ORAL | 4 refills | Status: DC
  Filled 2021-11-07: qty 86, 86d supply, fill #0
  Filled 2021-11-07: qty 4, 4d supply, fill #0
  Filled 2022-02-28: qty 90, 90d supply, fill #1
  Filled 2022-05-30: qty 90, 90d supply, fill #2
  Filled 2022-09-01: qty 90, 90d supply, fill #3

## 2021-12-01 DIAGNOSIS — N870 Mild cervical dysplasia: Secondary | ICD-10-CM | POA: Diagnosis not present

## 2021-12-01 DIAGNOSIS — R87610 Atypical squamous cells of undetermined significance on cytologic smear of cervix (ASC-US): Secondary | ICD-10-CM | POA: Diagnosis not present

## 2021-12-05 ENCOUNTER — Other Ambulatory Visit (HOSPITAL_COMMUNITY): Payer: Self-pay

## 2022-01-09 DIAGNOSIS — Z23 Encounter for immunization: Secondary | ICD-10-CM | POA: Diagnosis not present

## 2022-01-09 DIAGNOSIS — L439 Lichen planus, unspecified: Secondary | ICD-10-CM | POA: Diagnosis not present

## 2022-01-09 DIAGNOSIS — D485 Neoplasm of uncertain behavior of skin: Secondary | ICD-10-CM | POA: Diagnosis not present

## 2022-02-28 ENCOUNTER — Other Ambulatory Visit (HOSPITAL_COMMUNITY): Payer: Self-pay

## 2022-05-30 ENCOUNTER — Other Ambulatory Visit (HOSPITAL_COMMUNITY): Payer: Self-pay

## 2022-07-05 DIAGNOSIS — Z0000 Encounter for general adult medical examination without abnormal findings: Secondary | ICD-10-CM | POA: Diagnosis not present

## 2022-07-05 DIAGNOSIS — Z23 Encounter for immunization: Secondary | ICD-10-CM | POA: Diagnosis not present

## 2022-07-05 DIAGNOSIS — N951 Menopausal and female climacteric states: Secondary | ICD-10-CM | POA: Diagnosis not present

## 2022-07-05 DIAGNOSIS — Z13220 Encounter for screening for lipoid disorders: Secondary | ICD-10-CM | POA: Diagnosis not present

## 2022-07-06 ENCOUNTER — Telehealth: Payer: 59 | Admitting: Emergency Medicine

## 2022-07-06 ENCOUNTER — Other Ambulatory Visit (HOSPITAL_COMMUNITY): Payer: Self-pay

## 2022-07-06 DIAGNOSIS — H109 Unspecified conjunctivitis: Secondary | ICD-10-CM

## 2022-07-06 MED ORDER — POLYMYXIN B-TRIMETHOPRIM 10000-0.1 UNIT/ML-% OP SOLN
1.0000 [drp] | OPHTHALMIC | 0 refills | Status: DC
  Filled 2022-07-06: qty 10, 34d supply, fill #0

## 2022-07-06 NOTE — Progress Notes (Signed)
E-Visit for [employer]   We are sorry that you are not feeling well.  Here is how we plan to help!  Based on what you have shared with me it looks like you have conjunctivitis.  Conjunctivitis is a common inflammatory or infectious condition of the eye that is often referred to as "pink eye".  In most cases it is contagious (viral or bacterial). However, not all conjunctivitis requires antibiotics (ex. Allergic).  We have made appropriate suggestions for you based upon your presentation.  I have prescribed Polytrim Ophthalmic drops 1-2 drops 4 times a day times 5 days  Pink eye can be highly contagious.  It is typically spread through direct contact with secretions, or contaminated objects or surfaces that one may have touched.  Strict handwashing is suggested with soap and water is urged.  If not available, use alcohol based had sanitizer.  Avoid unnecessary touching of the eye.  If you wear contact lenses, you will need to refrain from wearing them until you see no white discharge from the eye for at least 24 hours after being on medication.  You should see symptom improvement in 1-2 days after starting the medication regimen.  Call us if symptoms are not improved in 1-2 days.  Home Care: Wash your hands often! Do not wear your contacts until you complete your treatment plan. Avoid sharing towels, bed linen, personal items with a person who has pink eye. See attention for anyone in your home with similar symptoms.  Get Help Right Away If: Your symptoms do not improve. You develop blurred or loss of vision. Your symptoms worsen (increased discharge, pain or redness)   Thank you for choosing an e-visit.  Your e-visit answers were reviewed by a board certified advanced clinical practitioner to complete your personal care plan. Depending upon the condition, your plan could have included both over the counter or prescription medications.  Please review your pharmacy choice. Make sure the  pharmacy is open so you can pick up prescription now. If there is a problem, you may contact your provider through [employer] and have the prescription routed to another pharmacy.  Your safety is important to [place]. If you have drug allergies check your prescription carefully.   For the next 24 hours you can use MyChart to ask questions about today's visit, request a non-urgent call back, or ask for a work or school excuse. You will get an email in the next two days asking about your experience. I hope that your e-visit has been valuable and will speed your recovery.   I have spent 5 minutes in review of e-visit questionnaire, review and updating patient chart, medical decision making and response to patient.   Willeen Cass, PhD, FNP-BC

## 2022-07-07 DIAGNOSIS — Z0000 Encounter for general adult medical examination without abnormal findings: Secondary | ICD-10-CM | POA: Diagnosis not present

## 2022-07-07 DIAGNOSIS — Z131 Encounter for screening for diabetes mellitus: Secondary | ICD-10-CM | POA: Diagnosis not present

## 2022-07-07 DIAGNOSIS — Z13220 Encounter for screening for lipoid disorders: Secondary | ICD-10-CM | POA: Diagnosis not present

## 2022-09-01 ENCOUNTER — Other Ambulatory Visit (HOSPITAL_COMMUNITY): Payer: Self-pay

## 2022-09-04 ENCOUNTER — Other Ambulatory Visit: Payer: Self-pay | Admitting: Family Medicine

## 2022-09-04 DIAGNOSIS — Z1231 Encounter for screening mammogram for malignant neoplasm of breast: Secondary | ICD-10-CM

## 2022-09-13 ENCOUNTER — Ambulatory Visit
Admission: RE | Admit: 2022-09-13 | Discharge: 2022-09-13 | Disposition: A | Discharge status: Home / Self Care | Payer: 59 | Source: Ambulatory Visit

## 2022-09-13 DIAGNOSIS — Z1231 Encounter for screening mammogram for malignant neoplasm of breast: Secondary | ICD-10-CM

## 2022-11-02 DIAGNOSIS — Z23 Encounter for immunization: Secondary | ICD-10-CM | POA: Diagnosis not present

## 2022-11-23 DIAGNOSIS — L814 Other melanin hyperpigmentation: Secondary | ICD-10-CM | POA: Diagnosis not present

## 2022-11-23 DIAGNOSIS — L578 Other skin changes due to chronic exposure to nonionizing radiation: Secondary | ICD-10-CM | POA: Diagnosis not present

## 2022-11-23 DIAGNOSIS — D1801 Hemangioma of skin and subcutaneous tissue: Secondary | ICD-10-CM | POA: Diagnosis not present

## 2022-11-23 DIAGNOSIS — D239 Other benign neoplasm of skin, unspecified: Secondary | ICD-10-CM | POA: Diagnosis not present

## 2022-11-23 DIAGNOSIS — L821 Other seborrheic keratosis: Secondary | ICD-10-CM | POA: Diagnosis not present

## 2022-11-29 ENCOUNTER — Other Ambulatory Visit (HOSPITAL_COMMUNITY): Payer: Self-pay

## 2022-11-29 DIAGNOSIS — Z13820 Encounter for screening for osteoporosis: Secondary | ICD-10-CM | POA: Diagnosis not present

## 2022-11-29 DIAGNOSIS — Z6822 Body mass index (BMI) 22.0-22.9, adult: Secondary | ICD-10-CM | POA: Diagnosis not present

## 2022-11-29 DIAGNOSIS — Z01419 Encounter for gynecological examination (general) (routine) without abnormal findings: Secondary | ICD-10-CM | POA: Diagnosis not present

## 2022-11-29 DIAGNOSIS — Z124 Encounter for screening for malignant neoplasm of cervix: Secondary | ICD-10-CM | POA: Diagnosis not present

## 2022-11-29 DIAGNOSIS — Z1151 Encounter for screening for human papillomavirus (HPV): Secondary | ICD-10-CM | POA: Diagnosis not present

## 2022-11-29 MED ORDER — PROGESTERONE MICRONIZED 100 MG PO CAPS
100.0000 mg | ORAL_CAPSULE | Freq: Every day | ORAL | 3 refills | Status: DC
  Filled 2022-11-29 – 2022-12-13 (×2): qty 90, 90d supply, fill #0
  Filled 2023-03-20: qty 90, 90d supply, fill #1
  Filled 2023-06-19: qty 90, 90d supply, fill #2
  Filled 2023-10-02: qty 90, 90d supply, fill #3

## 2022-11-30 ENCOUNTER — Other Ambulatory Visit: Payer: Self-pay | Admitting: Obstetrics and Gynecology

## 2022-11-30 DIAGNOSIS — Z1239 Encounter for other screening for malignant neoplasm of breast: Secondary | ICD-10-CM

## 2022-12-06 DIAGNOSIS — M8580 Other specified disorders of bone density and structure, unspecified site: Secondary | ICD-10-CM | POA: Diagnosis not present

## 2022-12-11 ENCOUNTER — Other Ambulatory Visit (HOSPITAL_COMMUNITY): Payer: Self-pay

## 2022-12-13 ENCOUNTER — Other Ambulatory Visit (HOSPITAL_COMMUNITY): Payer: Self-pay

## 2022-12-17 ENCOUNTER — Other Ambulatory Visit: Payer: Commercial Managed Care - PPO

## 2023-03-07 DIAGNOSIS — E559 Vitamin D deficiency, unspecified: Secondary | ICD-10-CM | POA: Diagnosis not present

## 2023-03-20 ENCOUNTER — Other Ambulatory Visit (HOSPITAL_COMMUNITY): Payer: Self-pay

## 2023-05-02 ENCOUNTER — Other Ambulatory Visit: Payer: Self-pay | Admitting: Oncology

## 2023-05-02 DIAGNOSIS — Z006 Encounter for examination for normal comparison and control in clinical research program: Secondary | ICD-10-CM

## 2023-06-05 ENCOUNTER — Telehealth: Payer: 59 | Admitting: Nurse Practitioner

## 2023-06-05 DIAGNOSIS — B9789 Other viral agents as the cause of diseases classified elsewhere: Secondary | ICD-10-CM

## 2023-06-05 DIAGNOSIS — J028 Acute pharyngitis due to other specified organisms: Secondary | ICD-10-CM

## 2023-06-05 NOTE — Progress Notes (Signed)
E-Visit for Sore Throat  We are sorry that you are not feeling well.  Here is how we plan to help!  We have seen sore throat as lasting symptom of COVID, typically if there is not a fever associated with a a sore throat it is likely from a virus.   Your symptoms indicate a likely viral infection (Pharyngitis).   Pharyngitis is inflammation in the back of the throat which can cause a sore throat, scratchiness and sometimes difficulty swallowing.   Pharyngitis is typically caused by a respiratory virus and will just run its course.  Please keep in mind that your symptoms could last up to 10 days.  For throat pain, we recommend over the counter oral pain relief medications such as acetaminophen or aspirin, or anti-inflammatory medications such as ibuprofen or naproxen sodium.  Topical treatments such as oral throat lozenges or sprays may be used as needed.  Avoid close contact with loved ones, especially the very young and elderly.  Remember to wash your hands thoroughly throughout the day as this is the number one way to prevent the spread of infection and wipe down door knobs and counters with disinfectant.  After careful review of your answers, I would not recommend an antibiotic for your condition.  Antibiotics should not be used to treat conditions that we suspect are caused by viruses like the virus that causes the common cold or flu. However, some people can have Strep with atypical symptoms. You may need formal testing in clinic or office to confirm if your symptoms continue or worsen.  Providers prescribe antibiotics to treat infections caused by bacteria. Antibiotics are very powerful in treating bacterial infections when they are used properly.  To maintain their effectiveness, they should be used only when necessary.  Overuse of antibiotics has resulted in the development of super bugs that are resistant to treatment!    Home Care: Only take medications as instructed by your medical team. Do  not drink alcohol while taking these medications. A steam or ultrasonic humidifier can help congestion.  You can place a towel over your head and breathe in the steam from hot water coming from a faucet. Avoid close contacts especially the very young and the elderly. Cover your mouth when you cough or sneeze. Always remember to wash your hands.  Get Help Right Away If: You develop worsening fever or throat pain. You develop a severe head ache or visual changes. Your symptoms persist after you have completed your treatment plan.  Make sure you Understand these instructions. Will watch your condition. Will get help right away if you are not doing well or get worse.   Thank you for choosing an e-visit.  Your e-visit answers were reviewed by a board certified advanced clinical practitioner to complete your personal care plan. Depending upon the condition, your plan could have included both over the counter or prescription medications.  Please review your pharmacy choice. Make sure the pharmacy is open so you can pick up prescription now. If there is a problem, you may contact your provider through [employer] and have the prescription routed to another pharmacy.  Your safety is important to [place]. If you have drug allergies check your prescription carefully.   For the next 24 hours you can use MyChart to ask questions about today's visit, request a non-urgent call back, or ask for a work or school excuse. You will get an email in the next two days asking about your experience. I hope that  your e-visit has been valuable and will speed your recovery.   I spent approximately 5 minutes reviewing the patient's history, current symptoms and coordinating their care today.

## 2023-06-14 ENCOUNTER — Other Ambulatory Visit: Payer: 59

## 2023-06-21 ENCOUNTER — Other Ambulatory Visit (HOSPITAL_COMMUNITY): Payer: Self-pay

## 2023-06-21 DIAGNOSIS — J029 Acute pharyngitis, unspecified: Secondary | ICD-10-CM | POA: Diagnosis not present

## 2023-06-21 MED ORDER — OMEPRAZOLE 40 MG PO CPDR
40.0000 mg | DELAYED_RELEASE_CAPSULE | Freq: Every day | ORAL | 0 refills | Status: DC
  Filled 2023-06-21: qty 30, 30d supply, fill #0

## 2023-07-10 DIAGNOSIS — Z0000 Encounter for general adult medical examination without abnormal findings: Secondary | ICD-10-CM | POA: Diagnosis not present

## 2023-07-10 DIAGNOSIS — E7800 Pure hypercholesterolemia, unspecified: Secondary | ICD-10-CM | POA: Diagnosis not present

## 2023-07-10 DIAGNOSIS — N951 Menopausal and female climacteric states: Secondary | ICD-10-CM | POA: Diagnosis not present

## 2023-07-10 DIAGNOSIS — R61 Generalized hyperhidrosis: Secondary | ICD-10-CM | POA: Diagnosis not present

## 2023-07-10 DIAGNOSIS — D75839 Thrombocytosis, unspecified: Secondary | ICD-10-CM | POA: Diagnosis not present

## 2023-07-10 DIAGNOSIS — D649 Anemia, unspecified: Secondary | ICD-10-CM | POA: Diagnosis not present

## 2023-07-15 ENCOUNTER — Other Ambulatory Visit: Payer: 59

## 2023-07-16 ENCOUNTER — Ambulatory Visit
Admission: RE | Admit: 2023-07-16 | Discharge: 2023-07-16 | Disposition: A | Discharge status: Home / Self Care | Payer: 59 | Source: Ambulatory Visit | Attending: Obstetrics and Gynecology | Admitting: Obstetrics and Gynecology

## 2023-07-16 DIAGNOSIS — Z1239 Encounter for other screening for malignant neoplasm of breast: Secondary | ICD-10-CM | POA: Diagnosis not present

## 2023-07-16 MED ORDER — GADOPICLENOL 0.5 MMOL/ML IV SOLN
6.0000 mL | Freq: Once | INTRAVENOUS | Status: AC | PRN
  Administered 2023-07-16: 6 mL via INTRAVENOUS

## 2023-08-12 ENCOUNTER — Emergency Department (HOSPITAL_BASED_OUTPATIENT_CLINIC_OR_DEPARTMENT_OTHER)
Admission: EM | Admit: 2023-08-12 | Discharge: 2023-08-13 | Disposition: A | Discharge status: Home / Self Care | Payer: 59 | Attending: Emergency Medicine | Admitting: Emergency Medicine

## 2023-08-12 ENCOUNTER — Emergency Department (HOSPITAL_BASED_OUTPATIENT_CLINIC_OR_DEPARTMENT_OTHER): Payer: 59

## 2023-08-12 ENCOUNTER — Encounter (HOSPITAL_BASED_OUTPATIENT_CLINIC_OR_DEPARTMENT_OTHER): Payer: Self-pay

## 2023-08-12 DIAGNOSIS — L03114 Cellulitis of left upper limb: Secondary | ICD-10-CM | POA: Diagnosis not present

## 2023-08-12 DIAGNOSIS — S51852A Open bite of left forearm, initial encounter: Secondary | ICD-10-CM | POA: Diagnosis not present

## 2023-08-12 DIAGNOSIS — W5503XA Scratched by cat, initial encounter: Secondary | ICD-10-CM | POA: Insufficient documentation

## 2023-08-12 DIAGNOSIS — L0390 Cellulitis, unspecified: Secondary | ICD-10-CM | POA: Diagnosis not present

## 2023-08-12 DIAGNOSIS — W5501XA Bitten by cat, initial encounter: Secondary | ICD-10-CM | POA: Diagnosis not present

## 2023-08-12 DIAGNOSIS — S50812A Abrasion of left forearm, initial encounter: Secondary | ICD-10-CM | POA: Insufficient documentation

## 2023-08-12 DIAGNOSIS — S50812D Abrasion of left forearm, subsequent encounter: Secondary | ICD-10-CM

## 2023-08-12 DIAGNOSIS — S61532A Puncture wound without foreign body of left wrist, initial encounter: Secondary | ICD-10-CM | POA: Diagnosis not present

## 2023-08-12 DIAGNOSIS — M7989 Other specified soft tissue disorders: Secondary | ICD-10-CM | POA: Diagnosis not present

## 2023-08-12 MED ORDER — SODIUM CHLORIDE 0.9 % IV SOLN
3.0000 g | Freq: Once | INTRAVENOUS | Status: AC
  Administered 2023-08-12: 3 g via INTRAVENOUS

## 2023-08-12 NOTE — ED Provider Notes (Signed)
[place] EMERGENCY DEPARTMENT AT [facility] Provider Note   CSN: [id number] Arrival date & time: 08/12/23  2046     History  Chief Complaint  Patient presents with   Animal Bite    Rachel Norman is a 57 y.o. female dents with concern for a cat scratch that happened yesterday night.  She states that this morning she was evaluated at an [place] walk-in clinic, and given Augmentin.  She has taken 2 doses of her Augmentin, but feels like the redness in her arm is spreading slightly.  Denies any fever or chills.  Reports the cat is known to her and is up-to-date on vaccines.  Last tetanus was updated 2021.   Animal Bite      Home Medications Prior to Admission medications   Medication Sig Start Date End Date Taking? Authorizing Provider  COVID-19 At Home Antigen Test [facility] COVID-19 HOME TEST) KIT Use as directed 05/30/21   Driscilla Grammes, [facility]  Estradiol (DIVIGEL) 0.75 MG/0.75GM GEL Apply 0.75 mg topically daily. Patient not taking: Reported on 06/30/2021 05/12/21     Estradiol (ESTROGEL) 0.75 MG/1.25 GM (0.06%) topical gel Place 1 pump onto the skin daily. Patient not taking: Reported on 06/30/2021 05/12/21     estradiol (VIVELLE-DOT) 0.05 MG/24HR patch Apply 1 patch twice a week by transdermal route. 05/17/21     estradiol (VIVELLE-DOT) 0.05 MG/24HR patch Place 1 patch (0.05 mg total) onto the skin 2 (two) times a week. 11/07/21     omeprazole (PRILOSEC) 40 MG capsule Take 1 capsule (40 mg total) by mouth daily. 06/21/23     progesterone (PROMETRIUM) 100 MG capsule Take 1 capsule (100 mg total) by mouth at bedtime. 05/12/21     progesterone (PROMETRIUM) 100 MG capsule Take 1 capsule (100 mg total) by mouth daily. 11/29/22     trimethoprim-polymyxin b (POLYTRIM) ophthalmic solution Place 1 drop into the left eye every 4 (four) hours. 07/06/22   Cathlyn Parsons, NP      Allergies    Patient has no known allergies.    Review of Systems   Review of Systems  Physical  Exam Updated Vital Signs BP 128/80   Pulse 73   Temp 98.1 F (36.7 C) (Oral)   Resp 18   LMP 06/11/2019   SpO2 98%  Physical Exam Vitals and nursing note reviewed.  Constitutional:      General: She is not in acute distress.    Appearance: She is well-developed.  HENT:     Head: Normocephalic and atraumatic.  Eyes:     Conjunctiva/sclera: Conjunctivae normal.  Cardiovascular:     Rate and Rhythm: Normal rate and regular rhythm.     Heart sounds: No murmur heard. Pulmonary:     Effort: Pulmonary effort is normal. No respiratory distress.     Breath sounds: Normal breath sounds.  Abdominal:     Palpations: Abdomen is soft.     Tenderness: There is no abdominal tenderness.  Musculoskeletal:        General: No swelling.     Cervical back: Neck supple.     Comments: Able to flex and extend at the left wrist without difficulty but with some pain.  Able to fully flex and extend at the left 1st through 5th MCP, PIP, DIPs Able to flex and extend at the left elbow without difficulty  Skin:    General: Skin is warm and dry.     Capillary Refill: Capillary refill takes less than 2  seconds.     Comments: Cat scratches to the distal left forearm, mild pus drainage out of one of the patch sites.  There is mild edema around the scratches, an area of erythema extending up the forearm about 4 cm from the scratch sites.  No red streaking  Tender to palpation over the scratch sites.  Neurological:     Mental Status: She is alert.  Psychiatric:        Mood and Affect: Mood normal.      ED Results / Procedures / Treatments   Labs (all labs ordered are listed, but only abnormal results are displayed) Labs Reviewed - No data to display  EKG None  Radiology DG Forearm Left  Result Date: 08/12/2023 CLINICAL DATA:  Recent cat bite with redness and swelling, initial encounter EXAM: LEFT FOREARM - 2 VIEW COMPARISON:  None Available. FINDINGS: No acute fracture or dislocation is noted.  Mild soft tissue swelling is noted in the area of clinical concern distally. No radiopaque foreign body is seen. No other focal abnormality is noted. IMPRESSION: Mild soft tissue swelling without acute bony abnormality. Electronically Signed   By: Alcide Clever M.D.   On: 08/12/2023 22:47    Procedures Procedures    Medications Ordered in ED Medications  Ampicillin-Sulbactam (UNASYN) 3 g in sodium chloride 0.9 % 100 mL IVPB (3 g Intravenous New Bag/Given 08/12/23 2338)    ED Course/ Medical Decision Making/ A&P                                 Medical Decision Making Amount and/or Complexity of Data Reviewed Radiology: ordered.   57 y.o. female with no significant past medical history presents to the ED for concern of cat scratch that occurred last night, now with redness of her arm  Differential diagnosis includes but is not limited to cat scratches, cellulitis, deep space infection, abscess  ED Course:  Patient overall well-appearing.  Normal vital signs, not tachycardic, afebrile.  She sustained a cat scratch to her left forearm last night.  This morning, was evaluated at a walk-in clinic and received Augmentin.  She took 2 doses of this medication today.  She feels like the redness in her arm has spread slightly since this morning.  There is no obvious abscess, still able to flex and extend at the wrist, flex and extend at the 1st through 5th MCP, PIP, DIP of the left hand.  Low concern for deep space infection at this time.  I discussed this case with Dr. Judd Lien and he feels patient could continue on outpatient antibiotics and return tomorrow if redness does not improve, or receive an IV dose of Unasyn here today.  I discussed this with patient, patient opts for IV Unasyn.   Impression: Cellulitis of left forearm due to cat scratch  Disposition:  The patient was discharged home with instructions to continue taking course of Augmentin as prescribed. Return precautions  given.          Final Clinical Impression(s) / ED Diagnoses Final diagnoses:  Cat scratch of left forearm, subsequent encounter  Cellulitis of left forearm    Rx / DC Orders ED Discharge Orders     None         Arabella Merles, PA-C 08/13/23 0007    Geoffery Lyons, MD 08/13/23 [phone]

## 2023-08-12 NOTE — ED Provider Notes (Incomplete)
[place] EMERGENCY DEPARTMENT AT [facility] Provider Note   CSN: [id number] Arrival date & time: 08/12/23  2046     History {Add pertinent medical, surgical, social history, OB history to HPI:1} Chief Complaint  Patient presents with  . Animal Bite    Rachel Norman is a 57 y.o. female dents with concern for a cat scratch that happened yesterday night.  She states that this morning she was evaluated at an [facility] walk-in clinic, and given Augmentin.  She has taken 2 doses of her Augmentin, but feels like the redness in her arm is spreading slightly.  Denies any fever or chills.  Reports the cat is known to her and is up-to-date on vaccines.  Last tetanus was updated 2021.   Animal Bite      Home Medications Prior to Admission medications   Medication Sig Start Date End Date Taking? Authorizing Provider  COVID-19 At Home Antigen Test [facility] COVID-19 HOME TEST) KIT Use as directed 05/30/21   Driscilla Grammes, [facility]  Estradiol (DIVIGEL) 0.75 MG/0.75GM GEL Apply 0.75 mg topically daily. Patient not taking: Reported on 06/30/2021 05/12/21     Estradiol (ESTROGEL) 0.75 MG/1.25 GM (0.06%) topical gel Place 1 pump onto the skin daily. Patient not taking: Reported on 06/30/2021 05/12/21     estradiol (VIVELLE-DOT) 0.05 MG/24HR patch Apply 1 patch twice a week by transdermal route. 05/17/21     estradiol (VIVELLE-DOT) 0.05 MG/24HR patch Place 1 patch (0.05 mg total) onto the skin 2 (two) times a week. 11/07/21     omeprazole (PRILOSEC) 40 MG capsule Take 1 capsule (40 mg total) by mouth daily. 06/21/23     progesterone (PROMETRIUM) 100 MG capsule Take 1 capsule (100 mg total) by mouth at bedtime. 05/12/21     progesterone (PROMETRIUM) 100 MG capsule Take 1 capsule (100 mg total) by mouth daily. 11/29/22     trimethoprim-polymyxin b (POLYTRIM) ophthalmic solution Place 1 drop into the left eye every 4 (four) hours. 07/06/22   Cathlyn Parsons, NP      Allergies    Patient has no known  allergies.    Review of Systems   Review of Systems  Physical Exam Updated Vital Signs BP 128/76   Pulse 75   Temp 98.1 F (36.7 C) (Oral)   Resp 20   LMP 06/11/2019   SpO2 99%  Physical Exam Vitals and nursing note reviewed.  Constitutional:      General: She is not in acute distress.    Appearance: She is well-developed.  HENT:     Head: Normocephalic and atraumatic.  Eyes:     Conjunctiva/sclera: Conjunctivae normal.  Cardiovascular:     Rate and Rhythm: Normal rate and regular rhythm.     Heart sounds: No murmur heard. Pulmonary:     Effort: Pulmonary effort is normal. No respiratory distress.     Breath sounds: Normal breath sounds.  Abdominal:     Palpations: Abdomen is soft.     Tenderness: There is no abdominal tenderness.  Musculoskeletal:        General: No swelling.     Cervical back: Neck supple.  Skin:    General: Skin is warm and dry.     Capillary Refill: Capillary refill takes less than 2 seconds.     Comments: Cat scratches to the distal left forearm, mild pus drainage out of one of the patch sites.  There is mild edema around the scratches, an area of erythema extending up the  forearm about 4 cm from the scratch sites.  No red streaking  Neurological:     Mental Status: She is alert.  Psychiatric:        Mood and Affect: Mood normal.      ED Results / Procedures / Treatments   Labs (all labs ordered are listed, but only abnormal results are displayed) Labs Reviewed - No data to display  EKG None  Radiology DG Forearm Left  Result Date: 08/12/2023 CLINICAL DATA:  Recent cat bite with redness and swelling, initial encounter EXAM: LEFT FOREARM - 2 VIEW COMPARISON:  None Available. FINDINGS: No acute fracture or dislocation is noted. Mild soft tissue swelling is noted in the area of clinical concern distally. No radiopaque foreign body is seen. No other focal abnormality is noted. IMPRESSION: Mild soft tissue swelling without acute bony  abnormality. Electronically Signed   By: Alcide Clever M.D.   On: 08/12/2023 22:47    Procedures Procedures  {Document cardiac monitor, telemetry assessment procedure when appropriate:1}  Medications Ordered in ED Medications - No data to display  ED Course/ Medical Decision Making/ A&P   {   Click here for ABCD2, HEART and other calculatorsREFRESH Note before signing :1}                              Medical Decision Making Amount and/or Complexity of Data Reviewed Radiology: ordered.   57 y.o. female with no significant past medical history presents to the ED for concern of cat scratch that occurred last night, now with redness of her arm  Differential diagnosis includes but is not limited to cat scratches, cellulitis, deep space infection, abscess  ED Course:  Patient overall well-appearing.  Normal vital signs, not tachycardic, afebrile.  She sustained a cat scratch to her left forearm last night.  This morning, was evaluated at a walk-in clinic and received Augmentin.  She took 2 doses of this medication today.  She feels like the redness in her arm has spread slightly since this morning.  There is no obvious abscess, still able to flex and extend at the wrist, flex and extend at the 1st through 5th MCP, PIP, DIP of the left hand.  Low concern for deep space infection at this time.  I discussed this case with Dr. Judd Lien and he feels patient could continue on outpatient antibiotics and return tomorrow if redness does not improve, or receive an IV dose of Unasyn here today.  I discussed this with patient, patient opts for IV Unasyn.   Impression: Cellulitis of left forearm due to cat scratch  Disposition:  The patient was discharged home with instructions to continue taking course of Augmentin as prescribed. Return precautions given.    {Document critical care time when appropriate:1} {Document review of labs and clinical decision tools ie heart score, Chads2Vasc2 etc:1}   {Document your independent review of radiology images, and any outside records:1} {Document your discussion with family members, caretakers, and with consultants:1} {Document social determinants of health affecting pt's care:1} {Document your decision making why or why not admission, treatments were needed:1} Final Clinical Impression(s) / ED Diagnoses Final diagnoses:  None    Rx / DC Orders ED Discharge Orders     None

## 2023-08-12 NOTE — ED Triage Notes (Signed)
Bit by own cats at home. Last night. Left lower arm  Seen at [facility] walk in clinic, today given augmentin and told to go to ED if if was worse. She thinks it might be "redder'

## 2023-08-12 NOTE — ED Notes (Signed)
Swelling and redness noted to posterior left wrist.

## 2023-08-13 NOTE — Discharge Instructions (Signed)
You were given a dose of IV antibiotic (ampicillin sulbactam) here today for your cat scratch and associated skin infection.  Please continue to take the course of Augmentin as prescribed.  Return to the ER for any fever, chills, spreading redness, red streaking up your arm, severe pain in your arm, any other new or concerning symptoms.

## 2023-08-14 DIAGNOSIS — S51852D Open bite of left forearm, subsequent encounter: Secondary | ICD-10-CM | POA: Diagnosis not present

## 2023-08-14 DIAGNOSIS — L089 Local infection of the skin and subcutaneous tissue, unspecified: Secondary | ICD-10-CM | POA: Diagnosis not present

## 2023-08-14 DIAGNOSIS — W5501XD Bitten by cat, subsequent encounter: Secondary | ICD-10-CM | POA: Diagnosis not present

## 2023-08-17 DIAGNOSIS — W5501XD Bitten by cat, subsequent encounter: Secondary | ICD-10-CM | POA: Diagnosis not present

## 2023-08-17 DIAGNOSIS — S51852D Open bite of left forearm, subsequent encounter: Secondary | ICD-10-CM | POA: Diagnosis not present

## 2023-08-23 DIAGNOSIS — R946 Abnormal results of thyroid function studies: Secondary | ICD-10-CM | POA: Diagnosis not present

## 2023-10-11 ENCOUNTER — Other Ambulatory Visit (HOSPITAL_COMMUNITY): Payer: Self-pay

## 2023-10-11 MED ORDER — COVID-19 MRNA VAC-TRIS(PFIZER) 30 MCG/0.3ML IM SUSY
0.3000 mL | PREFILLED_SYRINGE | Freq: Once | INTRAMUSCULAR | 0 refills | Status: AC
  Filled 2023-10-11: qty 0.3, 1d supply, fill #0

## 2023-11-07 ENCOUNTER — Other Ambulatory Visit: Payer: Self-pay | Admitting: Internal Medicine

## 2023-11-07 DIAGNOSIS — Z1231 Encounter for screening mammogram for malignant neoplasm of breast: Secondary | ICD-10-CM

## 2023-11-13 DIAGNOSIS — H5213 Myopia, bilateral: Secondary | ICD-10-CM | POA: Diagnosis not present

## 2023-11-21 ENCOUNTER — Ambulatory Visit
Admission: RE | Admit: 2023-11-21 | Discharge: 2023-11-21 | Disposition: A | Discharge status: Home / Self Care | Payer: 59 | Source: Ambulatory Visit

## 2023-11-21 DIAGNOSIS — Z1231 Encounter for screening mammogram for malignant neoplasm of breast: Secondary | ICD-10-CM

## 2023-11-27 ENCOUNTER — Other Ambulatory Visit (HOSPITAL_COMMUNITY): Payer: Self-pay

## 2023-11-27 DIAGNOSIS — D1801 Hemangioma of skin and subcutaneous tissue: Secondary | ICD-10-CM | POA: Diagnosis not present

## 2023-11-27 DIAGNOSIS — L578 Other skin changes due to chronic exposure to nonionizing radiation: Secondary | ICD-10-CM | POA: Diagnosis not present

## 2023-11-27 DIAGNOSIS — D239 Other benign neoplasm of skin, unspecified: Secondary | ICD-10-CM | POA: Diagnosis not present

## 2023-11-27 DIAGNOSIS — L570 Actinic keratosis: Secondary | ICD-10-CM | POA: Diagnosis not present

## 2023-11-27 DIAGNOSIS — L821 Other seborrheic keratosis: Secondary | ICD-10-CM | POA: Diagnosis not present

## 2023-11-27 DIAGNOSIS — C44519 Basal cell carcinoma of skin of other part of trunk: Secondary | ICD-10-CM | POA: Diagnosis not present

## 2023-11-27 DIAGNOSIS — L814 Other melanin hyperpigmentation: Secondary | ICD-10-CM | POA: Diagnosis not present

## 2023-11-27 DIAGNOSIS — D485 Neoplasm of uncertain behavior of skin: Secondary | ICD-10-CM | POA: Diagnosis not present

## 2023-11-27 MED ORDER — FLUOROURACIL 5 % EX CREA
1.0000 "application " | TOPICAL_CREAM | Freq: Two times a day (BID) | CUTANEOUS | 0 refills | Status: DC
  Filled 2023-11-27: qty 40, 21d supply, fill #0

## 2023-12-04 ENCOUNTER — Other Ambulatory Visit (HOSPITAL_COMMUNITY): Payer: Self-pay

## 2023-12-04 MED ORDER — ESTRADIOL 0.025 MG/24HR TD PTTW
1.0000 | MEDICATED_PATCH | TRANSDERMAL | 3 refills | Status: DC
  Filled 2023-12-04: qty 24, 84d supply, fill #0

## 2023-12-04 MED ORDER — PROGESTERONE MICRONIZED 100 MG PO CAPS
100.0000 mg | ORAL_CAPSULE | Freq: Every evening | ORAL | 3 refills | Status: DC
  Filled 2023-12-04: qty 90, 90d supply, fill #0

## 2023-12-10 ENCOUNTER — Other Ambulatory Visit (HOSPITAL_COMMUNITY): Payer: Self-pay

## 2023-12-20 DIAGNOSIS — C44519 Basal cell carcinoma of skin of other part of trunk: Secondary | ICD-10-CM | POA: Diagnosis not present

## 2023-12-21 ENCOUNTER — Other Ambulatory Visit (HOSPITAL_COMMUNITY): Payer: Self-pay

## 2023-12-21 DIAGNOSIS — Z124 Encounter for screening for malignant neoplasm of cervix: Secondary | ICD-10-CM | POA: Diagnosis not present

## 2023-12-21 DIAGNOSIS — Z79890 Hormone replacement therapy (postmenopausal): Secondary | ICD-10-CM | POA: Diagnosis not present

## 2023-12-21 DIAGNOSIS — Z6824 Body mass index (BMI) 24.0-24.9, adult: Secondary | ICD-10-CM | POA: Diagnosis not present

## 2023-12-21 DIAGNOSIS — Z01419 Encounter for gynecological examination (general) (routine) without abnormal findings: Secondary | ICD-10-CM | POA: Diagnosis not present

## 2023-12-21 DIAGNOSIS — E8941 Symptomatic postprocedural ovarian failure: Secondary | ICD-10-CM | POA: Diagnosis not present

## 2023-12-21 DIAGNOSIS — Z1151 Encounter for screening for human papillomavirus (HPV): Secondary | ICD-10-CM | POA: Diagnosis not present

## 2023-12-21 MED ORDER — ESTRADIOL 0.025 MG/24HR TD PTTW
1.0000 | MEDICATED_PATCH | TRANSDERMAL | 3 refills | Status: AC
  Filled 2023-12-21: qty 24, 84d supply, fill #0

## 2023-12-21 MED ORDER — PROGESTERONE MICRONIZED 100 MG PO CAPS
100.0000 mg | ORAL_CAPSULE | Freq: Every day | ORAL | 3 refills | Status: AC
  Filled 2023-12-21 – 2024-01-02 (×2): qty 90, 90d supply, fill #0
  Filled 2024-04-28: qty 90, 90d supply, fill #1
  Filled 2024-08-11: qty 90, 90d supply, fill #2

## 2024-01-02 ENCOUNTER — Other Ambulatory Visit (HOSPITAL_COMMUNITY): Payer: Self-pay

## 2024-01-03 ENCOUNTER — Other Ambulatory Visit (HOSPITAL_COMMUNITY): Payer: Self-pay

## 2024-04-28 ENCOUNTER — Other Ambulatory Visit (HOSPITAL_COMMUNITY): Payer: Self-pay

## 2024-06-30 ENCOUNTER — Other Ambulatory Visit: Payer: Self-pay | Admitting: Obstetrics and Gynecology

## 2024-06-30 DIAGNOSIS — Z9189 Other specified personal risk factors, not elsewhere classified: Secondary | ICD-10-CM

## 2024-07-06 ENCOUNTER — Encounter

## 2024-07-06 ENCOUNTER — Telehealth: Admitting: Physician Assistant

## 2024-07-06 DIAGNOSIS — M5441 Lumbago with sciatica, right side: Secondary | ICD-10-CM | POA: Diagnosis not present

## 2024-07-07 MED ORDER — NAPROXEN 500 MG PO TABS: 500.0000 mg | ORAL_TABLET | Freq: Two times a day (BID) | ORAL | 0 refills | Status: AC

## 2024-07-07 MED ORDER — CYCLOBENZAPRINE HCL 10 MG PO TABS
5.0000 mg | ORAL_TABLET | Freq: Three times a day (TID) | ORAL | 0 refills | Status: AC | PRN
Start: 2024-07-07 — End: ?

## 2024-07-07 NOTE — Progress Notes (Signed)

## 2024-07-09 ENCOUNTER — Other Ambulatory Visit (HOSPITAL_COMMUNITY): Payer: Self-pay

## 2024-07-09 DIAGNOSIS — M545 Low back pain, unspecified: Secondary | ICD-10-CM | POA: Diagnosis not present

## 2024-07-09 MED ORDER — METHYLPREDNISOLONE 4 MG PO TBPK
ORAL_TABLET | ORAL | 0 refills | Status: AC
  Filled 2024-07-09: qty 21, 6d supply, fill #0

## 2024-07-11 DIAGNOSIS — E7800 Pure hypercholesterolemia, unspecified: Secondary | ICD-10-CM | POA: Diagnosis not present

## 2024-07-11 DIAGNOSIS — M5136 Other intervertebral disc degeneration, lumbar region with discogenic back pain only: Secondary | ICD-10-CM | POA: Diagnosis not present

## 2024-07-11 DIAGNOSIS — D75839 Thrombocytosis, unspecified: Secondary | ICD-10-CM | POA: Diagnosis not present

## 2024-07-11 DIAGNOSIS — D649 Anemia, unspecified: Secondary | ICD-10-CM | POA: Diagnosis not present

## 2024-07-11 DIAGNOSIS — Z0000 Encounter for general adult medical examination without abnormal findings: Secondary | ICD-10-CM | POA: Diagnosis not present

## 2024-07-11 DIAGNOSIS — R7301 Impaired fasting glucose: Secondary | ICD-10-CM | POA: Diagnosis not present

## 2024-07-11 DIAGNOSIS — R946 Abnormal results of thyroid function studies: Secondary | ICD-10-CM | POA: Diagnosis not present

## 2024-07-11 DIAGNOSIS — N951 Menopausal and female climacteric states: Secondary | ICD-10-CM | POA: Diagnosis not present

## 2024-07-16 ENCOUNTER — Encounter: Payer: Self-pay | Admitting: Obstetrics and Gynecology

## 2024-07-22 ENCOUNTER — Ambulatory Visit: Attending: Physician Assistant | Admitting: Physical Therapy

## 2024-07-22 ENCOUNTER — Encounter: Payer: Self-pay | Admitting: Physical Therapy

## 2024-07-22 DIAGNOSIS — M62830 Muscle spasm of back: Secondary | ICD-10-CM | POA: Insufficient documentation

## 2024-07-22 DIAGNOSIS — M5459 Other low back pain: Secondary | ICD-10-CM | POA: Diagnosis present
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
## 2024-07-23 ENCOUNTER — Inpatient Hospital Stay
Admission: RE | Admit: 2024-07-23 | Discharge: 2024-07-23 | Disposition: A | Source: Ambulatory Visit | Attending: Obstetrics and Gynecology | Admitting: Obstetrics and Gynecology

## 2024-07-23 DIAGNOSIS — N6314 Unspecified lump in the right breast, lower inner quadrant: Secondary | ICD-10-CM | POA: Diagnosis not present

## 2024-07-23 DIAGNOSIS — Z9189 Other specified personal risk factors, not elsewhere classified: Secondary | ICD-10-CM

## 2024-07-23 MED ORDER — GADOPICLENOL 0.5 MMOL/ML IV SOLN
6.0000 mL | Freq: Once | INTRAVENOUS | Status: AC | PRN
  Administered 2024-07-23: 6 mL via INTRAVENOUS

## 2024-07-25 ENCOUNTER — Other Ambulatory Visit: Payer: Self-pay | Admitting: Obstetrics and Gynecology

## 2024-07-25 ENCOUNTER — Encounter: Payer: Self-pay | Admitting: Obstetrics and Gynecology

## 2024-07-25 DIAGNOSIS — R928 Other abnormal and inconclusive findings on diagnostic imaging of breast: Secondary | ICD-10-CM

## 2024-07-29 ENCOUNTER — Other Ambulatory Visit: Payer: Self-pay | Admitting: Obstetrics and Gynecology

## 2024-07-29 ENCOUNTER — Ambulatory Visit: Admitting: Physical Therapy

## 2024-07-29 DIAGNOSIS — R9389 Abnormal findings on diagnostic imaging of other specified body structures: Secondary | ICD-10-CM

## 2024-07-31 ENCOUNTER — Encounter: Payer: Self-pay | Admitting: Obstetrics and Gynecology

## 2024-08-04 ENCOUNTER — Ambulatory Visit
Admission: RE | Admit: 2024-08-04 | Discharge: 2024-08-04 | Disposition: A | Source: Ambulatory Visit | Attending: Obstetrics and Gynecology | Admitting: Obstetrics and Gynecology

## 2024-08-04 ENCOUNTER — Other Ambulatory Visit: Payer: Self-pay | Admitting: Obstetrics and Gynecology

## 2024-08-04 ENCOUNTER — Ambulatory Visit
Admission: RE | Admit: 2024-08-04 | Discharge: 2024-08-04 | Disposition: A | Discharge status: Home / Self Care | Source: Ambulatory Visit | Attending: Obstetrics and Gynecology | Admitting: Obstetrics and Gynecology

## 2024-08-04 DIAGNOSIS — R9389 Abnormal findings on diagnostic imaging of other specified body structures: Secondary | ICD-10-CM

## 2024-08-04 DIAGNOSIS — R928 Other abnormal and inconclusive findings on diagnostic imaging of breast: Secondary | ICD-10-CM | POA: Diagnosis not present

## 2024-08-04 DIAGNOSIS — N6314 Unspecified lump in the right breast, lower inner quadrant: Secondary | ICD-10-CM | POA: Diagnosis not present

## 2024-08-04 HISTORY — PX: BREAST BIOPSY: SHX20

## 2024-08-04 MED ORDER — IOPAMIDOL (ISOVUE-370) INJECTION 76%
100.0000 mL | Freq: Once | INTRAVENOUS | Status: AC | PRN
  Administered 2024-08-04: 100 mL via INTRAVENOUS

## 2024-08-05 ENCOUNTER — Ambulatory Visit: Admitting: Physical Therapy

## 2024-08-05 LAB — SURGICAL PATHOLOGY

## 2024-08-06 ENCOUNTER — Other Ambulatory Visit: Payer: Self-pay | Admitting: Obstetrics and Gynecology

## 2024-08-06 ENCOUNTER — Other Ambulatory Visit

## 2024-08-06 ENCOUNTER — Encounter

## 2024-08-06 DIAGNOSIS — Z9189 Other specified personal risk factors, not elsewhere classified: Secondary | ICD-10-CM

## 2024-08-12 ENCOUNTER — Ambulatory Visit: Admitting: Physical Therapy

## 2024-08-21 ENCOUNTER — Other Ambulatory Visit: Payer: Self-pay | Admitting: Medical Genetics

## 2024-08-21 DIAGNOSIS — Z006 Encounter for examination for normal comparison and control in clinical research program: Secondary | ICD-10-CM

## 2024-08-26 ENCOUNTER — Encounter: Payer: Self-pay | Admitting: Obstetrics and Gynecology

## 2024-09-06 ENCOUNTER — Ambulatory Visit
Admission: RE | Admit: 2024-09-06 | Discharge: 2024-09-06 | Disposition: A | Source: Ambulatory Visit | Attending: Obstetrics and Gynecology | Admitting: Obstetrics and Gynecology

## 2024-09-06 DIAGNOSIS — Z9189 Other specified personal risk factors, not elsewhere classified: Secondary | ICD-10-CM

## 2024-09-06 DIAGNOSIS — Z803 Family history of malignant neoplasm of breast: Secondary | ICD-10-CM | POA: Diagnosis not present

## 2024-09-06 DIAGNOSIS — N6489 Other specified disorders of breast: Secondary | ICD-10-CM | POA: Diagnosis not present

## 2024-09-06 MED ORDER — GADOPICLENOL 0.5 MMOL/ML IV SOLN
6.0000 mL | Freq: Once | INTRAVENOUS | Status: AC | PRN
  Administered 2024-09-06: 6 mL via INTRAVENOUS

## 2024-10-17 ENCOUNTER — Other Ambulatory Visit (HOSPITAL_COMMUNITY): Payer: Self-pay
